# Patient Record
Sex: Female | Born: 1975
Health system: Southern US, Community
[De-identification: ages and names within clinical notes are randomized; demographics above are authoritative.]

## PROBLEM LIST (undated history)

## (undated) DIAGNOSIS — N871 Moderate cervical dysplasia: Secondary | ICD-10-CM

## (undated) DIAGNOSIS — N870 Mild cervical dysplasia: Secondary | ICD-10-CM

## (undated) DIAGNOSIS — F32A Depression, unspecified: Secondary | ICD-10-CM

## (undated) DIAGNOSIS — B977 Papillomavirus as the cause of diseases classified elsewhere: Secondary | ICD-10-CM

## (undated) DIAGNOSIS — F419 Anxiety disorder, unspecified: Secondary | ICD-10-CM

## (undated) DIAGNOSIS — D069 Carcinoma in situ of cervix, unspecified: Secondary | ICD-10-CM

## (undated) DIAGNOSIS — L709 Acne, unspecified: Secondary | ICD-10-CM

## (undated) DIAGNOSIS — Z789 Other specified health status: Secondary | ICD-10-CM

## (undated) DIAGNOSIS — F329 Major depressive disorder, single episode, unspecified: Secondary | ICD-10-CM

## (undated) HISTORY — DX: Moderate cervical dysplasia: N87.1

## (undated) HISTORY — DX: Anxiety disorder, unspecified: F41.9

## (undated) HISTORY — DX: Major depressive disorder, single episode, unspecified: F32.9

## (undated) HISTORY — DX: Other specified health status: Z78.9

## (undated) HISTORY — DX: Mild cervical dysplasia: N87.0

## (undated) HISTORY — DX: Papillomavirus as the cause of diseases classified elsewhere: B97.7

## (undated) HISTORY — DX: Carcinoma in situ of cervix, unspecified: D06.9

## (undated) HISTORY — DX: Acne, unspecified: L70.9

## (undated) HISTORY — DX: Depression, unspecified: F32.A

## (undated) HISTORY — PX: TYMPANOSTOMY TUBE PLACEMENT: SHX32

---

## 2000-08-28 ENCOUNTER — Other Ambulatory Visit: Admission: RE | Admit: 2000-08-28 | Discharge: 2000-08-28 | Payer: Self-pay | Admitting: Obstetrics and Gynecology

## 2001-07-24 ENCOUNTER — Other Ambulatory Visit: Admission: RE | Admit: 2001-07-24 | Discharge: 2001-07-24 | Payer: Self-pay | Admitting: *Deleted

## 2001-08-11 DIAGNOSIS — N871 Moderate cervical dysplasia: Secondary | ICD-10-CM

## 2001-08-11 HISTORY — DX: Moderate cervical dysplasia: N87.1

## 2001-10-11 DIAGNOSIS — D069 Carcinoma in situ of cervix, unspecified: Secondary | ICD-10-CM

## 2001-10-11 HISTORY — DX: Carcinoma in situ of cervix, unspecified: D06.9

## 2002-02-25 ENCOUNTER — Other Ambulatory Visit: Admission: RE | Admit: 2002-02-25 | Discharge: 2002-02-25 | Payer: Self-pay | Admitting: Gynecology

## 2002-10-28 ENCOUNTER — Other Ambulatory Visit: Admission: RE | Admit: 2002-10-28 | Discharge: 2002-10-28 | Payer: Self-pay | Admitting: *Deleted

## 2003-02-22 ENCOUNTER — Other Ambulatory Visit: Admission: RE | Admit: 2003-02-22 | Discharge: 2003-02-22 | Payer: Self-pay | Admitting: Gynecology

## 2003-09-22 ENCOUNTER — Other Ambulatory Visit: Admission: RE | Admit: 2003-09-22 | Discharge: 2003-09-22 | Payer: Self-pay | Admitting: Gynecology

## 2004-02-09 ENCOUNTER — Other Ambulatory Visit: Admission: RE | Admit: 2004-02-09 | Discharge: 2004-02-09 | Payer: Self-pay | Admitting: Obstetrics and Gynecology

## 2004-06-20 ENCOUNTER — Other Ambulatory Visit: Admission: RE | Admit: 2004-06-20 | Discharge: 2004-06-20 | Payer: Self-pay | Admitting: Gynecology

## 2004-12-13 ENCOUNTER — Other Ambulatory Visit: Admission: RE | Admit: 2004-12-13 | Discharge: 2004-12-13 | Payer: Self-pay | Admitting: Gynecology

## 2005-12-19 ENCOUNTER — Other Ambulatory Visit: Admission: RE | Admit: 2005-12-19 | Discharge: 2005-12-19 | Payer: Self-pay | Admitting: Gynecology

## 2006-12-22 ENCOUNTER — Other Ambulatory Visit: Admission: RE | Admit: 2006-12-22 | Discharge: 2006-12-22 | Payer: Self-pay | Admitting: Gynecology

## 2008-03-17 ENCOUNTER — Other Ambulatory Visit: Admission: RE | Admit: 2008-03-17 | Discharge: 2008-03-17 | Payer: Self-pay | Admitting: Gynecology

## 2008-08-02 ENCOUNTER — Emergency Department (HOSPITAL_COMMUNITY): Admission: EM | Admit: 2008-08-02 | Discharge: 2008-08-02 | Payer: Self-pay | Admitting: Family Medicine

## 2008-11-28 ENCOUNTER — Ambulatory Visit: Payer: Self-pay | Admitting: Vascular Surgery

## 2009-05-10 ENCOUNTER — Other Ambulatory Visit: Admission: RE | Admit: 2009-05-10 | Discharge: 2009-05-10 | Payer: Self-pay | Admitting: Gynecology

## 2009-05-10 ENCOUNTER — Ambulatory Visit: Payer: Self-pay | Admitting: Women's Health

## 2009-05-10 ENCOUNTER — Encounter: Payer: Self-pay | Admitting: Women's Health

## 2009-06-08 ENCOUNTER — Ambulatory Visit: Payer: Self-pay | Admitting: Gynecology

## 2009-06-08 DIAGNOSIS — N870 Mild cervical dysplasia: Secondary | ICD-10-CM

## 2009-06-08 HISTORY — DX: Mild cervical dysplasia: N87.0

## 2009-06-27 ENCOUNTER — Ambulatory Visit: Payer: Self-pay | Admitting: Gynecology

## 2009-06-27 HISTORY — PX: GYNECOLOGIC CRYOSURGERY: SHX857

## 2010-05-25 ENCOUNTER — Other Ambulatory Visit: Admission: RE | Admit: 2010-05-25 | Discharge: 2010-05-25 | Payer: Self-pay | Admitting: Gynecology

## 2010-05-25 ENCOUNTER — Ambulatory Visit: Payer: Self-pay | Admitting: Women's Health

## 2011-03-21 ENCOUNTER — Ambulatory Visit: Payer: Self-pay | Admitting: Women's Health

## 2011-04-12 DIAGNOSIS — Z789 Other specified health status: Secondary | ICD-10-CM

## 2011-04-12 HISTORY — DX: Other specified health status: Z78.9

## 2011-05-01 ENCOUNTER — Other Ambulatory Visit (HOSPITAL_COMMUNITY)
Admission: RE | Admit: 2011-05-01 | Discharge: 2011-05-01 | Disposition: A | Discharge status: Home / Self Care | Payer: 59 | Source: Ambulatory Visit | Attending: Gynecology | Admitting: Gynecology

## 2011-05-01 ENCOUNTER — Other Ambulatory Visit: Payer: Self-pay | Admitting: Women's Health

## 2011-05-01 ENCOUNTER — Encounter (INDEPENDENT_AMBULATORY_CARE_PROVIDER_SITE_OTHER): Payer: 59 | Admitting: Women's Health

## 2011-05-01 DIAGNOSIS — Z01419 Encounter for gynecological examination (general) (routine) without abnormal findings: Secondary | ICD-10-CM

## 2011-05-01 DIAGNOSIS — Z124 Encounter for screening for malignant neoplasm of cervix: Secondary | ICD-10-CM | POA: Insufficient documentation

## 2011-05-01 DIAGNOSIS — Z113 Encounter for screening for infections with a predominantly sexual mode of transmission: Secondary | ICD-10-CM

## 2011-06-06 ENCOUNTER — Other Ambulatory Visit: Payer: Self-pay | Admitting: *Deleted

## 2011-06-06 DIAGNOSIS — IMO0001 Reserved for inherently not codable concepts without codable children: Secondary | ICD-10-CM

## 2011-06-06 MED ORDER — NORGESTIM-ETH ESTRAD TRIPHASIC 0.18/0.215/0.25 MG-35 MCG PO TABS: 1.0000 | ORAL_TABLET | Freq: Every day | ORAL | Status: DC

## 2011-06-06 NOTE — Progress Notes (Signed)
rf request from pharmacy

## 2012-04-16 ENCOUNTER — Telehealth: Payer: Self-pay | Admitting: *Deleted

## 2012-04-16 ENCOUNTER — Other Ambulatory Visit: Payer: Self-pay | Admitting: Women's Health

## 2012-04-16 DIAGNOSIS — N912 Amenorrhea, unspecified: Secondary | ICD-10-CM

## 2012-04-16 DIAGNOSIS — O3680X0 Pregnancy with inconclusive fetal viability, not applicable or unspecified: Secondary | ICD-10-CM

## 2012-04-16 NOTE — Telephone Encounter (Signed)
Left message on cell, prenatal vitamin with DHA and will discuss office visit with viability ultrasound

## 2012-04-16 NOTE — Telephone Encounter (Signed)
Telephone call, states had a normal 5 day cycle on April 5, at normal time and then again on April 20 for 5 days. Has had 2 positive U PT's,  no bleeding, pain or discharge. Reviewed is between 7 and [redacted] weeks pregnant instructed to call office and will get a viability ultrasound. Aware we no longer deliver. Reviewed importance of prenatal vitamin daily and safe pregnancy behaviors reviewed. Married on April 6 and has used no contraception.

## 2012-04-16 NOTE — Telephone Encounter (Signed)
Pt has taken 2 UPT test and both were positive, she has remarried since April 6th and both are very happy about this good new. Pt would like to start on a prenatal vit. Her annual is scheduled on June 21. I told pt that she could do OTC vit. But she would like your recommendations. Call back number if needed [phone]. Please advise

## 2012-05-01 ENCOUNTER — Ambulatory Visit (INDEPENDENT_AMBULATORY_CARE_PROVIDER_SITE_OTHER): Payer: BC Managed Care – PPO | Admitting: Women's Health

## 2012-05-01 ENCOUNTER — Other Ambulatory Visit (HOSPITAL_COMMUNITY)
Admission: RE | Admit: 2012-05-01 | Discharge: 2012-05-01 | Disposition: A | Discharge status: Home / Self Care | Payer: BC Managed Care – PPO | Source: Ambulatory Visit | Attending: Women's Health | Admitting: Women's Health

## 2012-05-01 ENCOUNTER — Encounter: Payer: Self-pay | Admitting: Women's Health

## 2012-05-01 VITALS — BP 114/70 | Ht 63.75 in | Wt 138.0 lb

## 2012-05-01 DIAGNOSIS — Z3490 Encounter for supervision of normal pregnancy, unspecified, unspecified trimester: Secondary | ICD-10-CM

## 2012-05-01 DIAGNOSIS — Z01419 Encounter for gynecological examination (general) (routine) without abnormal findings: Secondary | ICD-10-CM | POA: Insufficient documentation

## 2012-05-01 DIAGNOSIS — Z124 Encounter for screening for malignant neoplasm of cervix: Secondary | ICD-10-CM

## 2012-05-01 DIAGNOSIS — Z331 Pregnant state, incidental: Secondary | ICD-10-CM

## 2012-05-01 DIAGNOSIS — N912 Amenorrhea, unspecified: Secondary | ICD-10-CM

## 2012-05-01 DIAGNOSIS — Z1159 Encounter for screening for other viral diseases: Secondary | ICD-10-CM | POA: Insufficient documentation

## 2012-05-01 LAB — PREGNANCY, URINE: Preg Test, Ur: POSITIVE

## 2012-05-01 MED ORDER — SERTRALINE HCL 25 MG PO TABS: 25.0000 mg | ORAL_TABLET | Freq: Every day | ORAL | Status: DC

## 2012-05-01 NOTE — Patient Instructions (Signed)
Pregnancy  If you are planning on getting pregnant, it is a good idea to make a preconception appointment with your care- giver to discuss having a healthy lifestyle before getting pregnant. Such as, diet, weight, exercise, taking prenatal vitamins especially folic acid (it helps prevent brain and spinal cord defects), avoiding alcohol, smoking and illegal drugs, medical problems (diabetes, convulsions), family history of genetic problems, working conditions and immunizations. It is better to have knowledge of these things and do something about them before getting pregnant.  In your pregnancy, it is important to follow certain guidelines to have a healthy baby. It is very important to get good prenatal care and follow your caregiver's instructions. Prenatal care includes all the medical care you receive before your baby's birth. This helps to prevent problems during the pregnancy and childbirth.  HOME CARE INSTRUCTIONS    Start your prenatal visits by the 12th week of pregnancy or before when possible. They are usually scheduled monthly at first. They are more often in the last 2 months before delivery. It is important that you keep your caregiver's appointments and follow your caregiver's instructions regarding medication use, exercise, and diet.   During pregnancy, you are providing food for you and your baby. Eat a regular, well-balanced diet. Choose foods such as meat, fish, milk and other dairy products, vegetables, fruits, whole-grain breads and cereals. Your caregiver will inform you of the ideal weight gain depending on your current height and weight. Drink lots of liquids. Try to drink 8 glasses of water a day.   Alcohol is associated with a number of birth defects including fetal alcohol syndrome. It is best to avoid alcohol completely. Smoking will cause low birth rate and prematurity. Use of alcohol and nicotine during your pregnancy also increases the chances that your child will be chemically  dependent later in their life and may contribute to SIDS (Sudden Infant Death Syndrome).   Do not use illegal drugs.   Only take prescription or over-the-counter medications that are recommended by your caregiver. Other medications can cause genetic and physical problems in the baby.   Morning sickness can often be helped by keeping soda crackers at the bedside. Eat a couple before arising in the morning.   A sexual relationship may be continued until near the end of pregnancy if there are no other problems such as early (premature) leaking of amniotic fluid from the membranes, vaginal bleeding, painful intercourse or belly (abdominal) pain.   Exercise regularly. Check with your caregiver if you are unsure of the safety of some of your exercises.   Do not use hot tubs, steam rooms or saunas. These increase the risk of fainting or passing out and hurting yourself and the baby. Swimming is OK for exercise. Get plenty of rest, including afternoon naps when possible especially in the third trimester.   Avoid toxic odors and chemicals.   Do not wear high heels. They may cause you to lose your balance and fall.   Do not lift over 5 pounds. If you do lift anything, lift with your legs and thighs, not your back.   Avoid long trips, especially in the third trimester.   If you have to travel out of the city or state, take a copy of your medical records with you.  SEEK IMMEDIATE MEDICAL CARE IF:    You develop an unexplained oral temperature above 102 F (38.9 C), or as your caregiver suggests.   You have leaking of fluid from the vagina. If   leaking membranes are suspected, take your temperature and inform your caregiver of this when you call.   There is vaginal spotting or bleeding. Notify your caregiver of the amount and how many pads are used.   You continue to feel sick to your stomach (nauseous) and have no relief from remedies suggested, or you throw up (vomit) blood or coffee ground like  materials.   You develop upper abdominal pain.   You have round ligament discomfort in the lower abdominal area. This still must be evaluated by your caregiver.   You feel contractions of the uterus.   You do not feel the baby move, or there is less movement than before.   You have painful urination.   You have abnormal vaginal discharge.   You have persistent diarrhea.   You get a severe headache.   You have problems with your vision.   You develop muscle weakness.   You feel dizzy and faint.   You develop shortness of breath.   You develop chest pain.   You have back pain that travels down to your leg and feet.   You feel irregular or a very fast heartbeat.   You develop excessive weight gain in a short period of time (5 pounds in 3 to 5 days).   You are involved with a domestic violence situation.  Document Released: 10/28/2005 Document Revised: 10/17/2011 Document Reviewed: 04/21/2009  ExitCare Patient Information 2012 [employer].

## 2012-05-01 NOTE — Progress Notes (Signed)
Hannah Flores 1976/11/09 [id number]    History:    The patient presents for annual exam.  Reports 2 positive home pregnancy tests 3 weeks ago. LMP 03/03/12. Denies abdominal pain, vaginal bleeding, or nausea. 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 Unsure of last Tdap. Hx depression, had been on cymbalta, changed to  Zoloft 25 mg prior to pregnancy, reports stable moods.    Past medical history, past surgical history, family history and social history were all reviewed and documented in the EPIC chart. Works in Therap[employer], sports.    ROS:  A  ROS was performed and pertinent positives and negatives are included in the history.  Exam:  Filed Vitals:   05/01/12 0809  BP: 114/70    General appearance:  Normal  Neurologic/psychiatric  Orientation:  Normal with appropriate conversation.  Mood/affect:  Normal  Genitourinary    Inguinal/mons:  Normal without inguinal adenopathy  External genitalia:  Normal  BUS/Urethra/Skene's glands:  Normal  Bladder:  Normal  Vagina:  Normal  Cervix:  Normal  Uterus:  Normal in size, shape and contour.  Midline and mobile  Adnexa/parametria:     Rt: Without masses or tenderness.   Lt: Without masses or tenderness.  Anus and perineum: Normal   Assessment:  36 y.o.  MWF G0 for annual exam with positive UPT.  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 Instructed to discontinue spirinolactone. Zoloft 25 mg prescription, use reviewed.  Pap  With HR HPV typing reviewed done, new screening guidelines reviewed. Encouraged daily prenatal vitamin with DHA and regular exercise. Instructed no alcohol,  avoid OTC medication use. Aware of safe pregnancy behaviors.  Return to office for viability [place].  Aware we no longer deliver.     Harrington Challenger [facility], 8:53 AM 05/01/2012

## 2012-05-08 ENCOUNTER — Encounter: Payer: Self-pay | Admitting: Women's Health

## 2012-05-08 ENCOUNTER — Ambulatory Visit (INDEPENDENT_AMBULATORY_CARE_PROVIDER_SITE_OTHER): Payer: BC Managed Care – PPO | Admitting: Women's Health

## 2012-05-08 ENCOUNTER — Ambulatory Visit (INDEPENDENT_AMBULATORY_CARE_PROVIDER_SITE_OTHER): Payer: BC Managed Care – PPO

## 2012-05-08 DIAGNOSIS — F329 Major depressive disorder, single episode, unspecified: Secondary | ICD-10-CM | POA: Insufficient documentation

## 2012-05-08 DIAGNOSIS — O09519 Supervision of elderly primigravida, unspecified trimester: Secondary | ICD-10-CM

## 2012-05-08 DIAGNOSIS — N912 Amenorrhea, unspecified: Secondary | ICD-10-CM

## 2012-05-08 DIAGNOSIS — O3680X0 Pregnancy with inconclusive fetal viability, not applicable or unspecified: Secondary | ICD-10-CM

## 2012-05-08 LAB — US OB TRANSVAGINAL

## 2012-05-08 NOTE — Progress Notes (Signed)
Presents for viability [place]. Denies vaginal bleeding, nausea, or abdominal pain.  Ultrasound: Intrauterine gestational sac seen with yolk sac. Fetal pole seen with FHT - 171. Ovaries normal. No free fluid seen. S=D. EDC: 12/01/12.  Pregnancy:  Hx: LGSIL/CIN I Negative HR HPV 05/01/12 Long term history of depression, stable on zoloft 25 mg daily  Plan: Prenatal vitamin with DHA daily encouraged. Referred to OB for pregnancy management, instructed to schedule. Copy of pap and [place] given with instructions to take to first prenanatal appointment.

## 2012-05-11 ENCOUNTER — Telehealth: Payer: Self-pay

## 2012-05-11 NOTE — Telephone Encounter (Signed)
Telephone call states not feeling increased depression but is feeling more short tempered with slight anxiety. States will remain on the 25 Zoloft and will review medication at first prenatal visit. Reviewed less is best especially first trimester.

## 2012-05-11 NOTE — Telephone Encounter (Signed)
Left message to call to discuss medication

## 2012-05-11 NOTE — Telephone Encounter (Signed)
YOU JUST SENT I RX 05-08-12 FOR GENERIC ZOLOFT 25MG . PT. STATES SHE HAS NOT PICKED IT UP YET AND IS REQUESTING TO INCREASE RX TO 50MG .

## 2012-06-30 LAB — OB RESULTS CONSOLE HIV ANTIBODY (ROUTINE TESTING): HIV: NONREACTIVE

## 2012-06-30 LAB — OB RESULTS CONSOLE ABO/RH: RH Type: POSITIVE

## 2012-07-08 LAB — OB RESULTS CONSOLE GC/CHLAMYDIA
Chlamydia: NEGATIVE
Gonorrhea: NEGATIVE

## 2012-11-02 LAB — OB RESULTS CONSOLE GBS: GBS: NEGATIVE

## 2012-11-28 ENCOUNTER — Encounter (HOSPITAL_COMMUNITY): Payer: Self-pay | Admitting: *Deleted

## 2012-11-28 ENCOUNTER — Inpatient Hospital Stay (HOSPITAL_COMMUNITY)
Admission: AD | Admit: 2012-11-28 | Discharge: 2012-12-01 | DRG: 765 | Disposition: A | Discharge status: Home / Self Care | Payer: 59 | Source: Ambulatory Visit | Attending: Obstetrics and Gynecology | Admitting: Obstetrics and Gynecology

## 2012-11-28 DIAGNOSIS — O9903 Anemia complicating the puerperium: Secondary | ICD-10-CM | POA: Diagnosis not present

## 2012-11-28 DIAGNOSIS — O09519 Supervision of elderly primigravida, unspecified trimester: Secondary | ICD-10-CM | POA: Diagnosis not present

## 2012-11-28 DIAGNOSIS — D62 Acute posthemorrhagic anemia: Secondary | ICD-10-CM | POA: Diagnosis not present

## 2012-11-28 DIAGNOSIS — Z98891 History of uterine scar from previous surgery: Secondary | ICD-10-CM | POA: Diagnosis not present

## 2012-11-28 DIAGNOSIS — O409XX0 Polyhydramnios, unspecified trimester, not applicable or unspecified: Secondary | ICD-10-CM | POA: Diagnosis present

## 2012-11-28 DIAGNOSIS — O3660X0 Maternal care for excessive fetal growth, unspecified trimester, not applicable or unspecified: Secondary | ICD-10-CM | POA: Diagnosis present

## 2012-11-28 LAB — POCT FERN TEST: POCT Fern Test: POSITIVE

## 2012-11-28 NOTE — Progress Notes (Signed)
Annabelle Harman RN in [facility] called with report. Pt to BS ambulatory from Triage.

## 2012-11-28 NOTE — MAU Note (Signed)
I've been leaking bloody water for about an hour. No pain

## 2012-11-29 ENCOUNTER — Encounter (HOSPITAL_COMMUNITY): Payer: Self-pay | Admitting: *Deleted

## 2012-11-29 ENCOUNTER — Inpatient Hospital Stay (HOSPITAL_COMMUNITY): Payer: 59 | Admitting: Anesthesiology

## 2012-11-29 ENCOUNTER — Encounter (HOSPITAL_COMMUNITY): Payer: Self-pay | Admitting: Anesthesiology

## 2012-11-29 ENCOUNTER — Encounter (HOSPITAL_COMMUNITY)
Admission: AD | Disposition: A | Discharge status: Home / Self Care | Payer: Self-pay | Source: Ambulatory Visit | Attending: Obstetrics and Gynecology

## 2012-11-29 LAB — CBC
Hemoglobin: 12.8 g/dL (ref 12.0–15.0)
MCH: 31.1 pg (ref 26.0–34.0)
MCHC: 34.6 g/dL (ref 30.0–36.0)

## 2012-11-29 LAB — RPR: RPR Ser Ql: NONREACTIVE

## 2012-11-29 SURGERY — Surgical Case
Anesthesia: Regional | Site: Abdomen | Wound class: Clean Contaminated

## 2012-11-29 MED ORDER — LACTATED RINGERS IV SOLN
INTRAVENOUS | Status: DC
  Administered 2012-11-29: 06:00:00 via INTRAVENOUS

## 2012-11-29 MED ORDER — ZOLPIDEM TARTRATE 5 MG PO TABS: 5.0000 mg | ORAL_TABLET | Freq: Every evening | ORAL | Status: DC | PRN

## 2012-11-29 MED ORDER — PHENYLEPHRINE 40 MCG/ML (10ML) SYRINGE FOR IV PUSH (FOR BLOOD PRESSURE SUPPORT)
PREFILLED_SYRINGE | INTRAVENOUS | Status: AC
  Filled 2012-11-29: qty 5

## 2012-11-29 MED ORDER — PRENATAL MULTIVITAMIN CH
1.0000 | ORAL_TABLET | Freq: Every day | ORAL | Status: DC
  Administered 2012-11-30 – 2012-12-01 (×2): 1 via ORAL
  Filled 2012-11-29 (×2): qty 1

## 2012-11-29 MED ORDER — CEFAZOLIN SODIUM-DEXTROSE 2-3 GM-% IV SOLR
INTRAVENOUS | Status: AC
  Filled 2012-11-29: qty 50

## 2012-11-29 MED ORDER — KETOROLAC TROMETHAMINE 30 MG/ML IJ SOLN: 30.0000 mg | Freq: Four times a day (QID) | INTRAMUSCULAR | Status: AC | PRN

## 2012-11-29 MED ORDER — ACETAMINOPHEN 325 MG PO TABS: 650.0000 mg | ORAL_TABLET | ORAL | Status: DC | PRN

## 2012-11-29 MED ORDER — IBUPROFEN 600 MG PO TABS
600.0000 mg | ORAL_TABLET | Freq: Four times a day (QID) | ORAL | Status: DC
  Administered 2012-11-29 – 2012-12-01 (×7): 600 mg via ORAL
  Filled 2012-11-29 (×7): qty 1

## 2012-11-29 MED ORDER — METHYLERGONOVINE MALEATE 0.2 MG PO TABS: 0.2000 mg | ORAL_TABLET | ORAL | Status: DC | PRN

## 2012-11-29 MED ORDER — OXYCODONE-ACETAMINOPHEN 5-325 MG PO TABS
1.0000 | ORAL_TABLET | ORAL | Status: DC | PRN
  Administered 2012-11-29 – 2012-12-01 (×9): 1 via ORAL
  Filled 2012-11-29 (×9): qty 1

## 2012-11-29 MED ORDER — PHENYLEPHRINE HCL 10 MG/ML IJ SOLN: INTRAMUSCULAR | Status: DC | PRN

## 2012-11-29 MED ORDER — BUPIVACAINE HCL (PF) 0.25 % IJ SOLN
INTRAMUSCULAR | Status: AC
  Filled 2012-11-29: qty 30

## 2012-11-29 MED ORDER — MORPHINE SULFATE (PF) 0.5 MG/ML IJ SOLN
INTRAMUSCULAR | Status: DC | PRN
  Administered 2012-11-29: .2 mg via INTRATHECAL

## 2012-11-29 MED ORDER — NALBUPHINE HCL 10 MG/ML IJ SOLN
5.0000 mg | INTRAMUSCULAR | Status: DC | PRN
  Filled 2012-11-29: qty 1

## 2012-11-29 MED ORDER — SERTRALINE HCL 25 MG PO TABS
25.0000 mg | ORAL_TABLET | Freq: Every day | ORAL | Status: DC
  Administered 2012-11-29 – 2012-12-01 (×2): 25 mg via ORAL
  Filled 2012-11-29 (×4): qty 1

## 2012-11-29 MED ORDER — NALOXONE HCL 1 MG/ML IJ SOLN: 1.0000 ug/kg/h | INTRAVENOUS | Status: DC | PRN

## 2012-11-29 MED ORDER — ONDANSETRON HCL 4 MG PO TABS: 4.0000 mg | ORAL_TABLET | ORAL | Status: DC | PRN

## 2012-11-29 MED ORDER — OXYTOCIN 40 UNITS IN LACTATED RINGERS INFUSION - SIMPLE MED
1.0000 m[IU]/min | INTRAVENOUS | Status: DC
  Administered 2012-11-29: 2 m[IU]/min via INTRAVENOUS
  Filled 2012-11-29: qty 1000

## 2012-11-29 MED ORDER — FENTANYL CITRATE 0.05 MG/ML IJ SOLN
INTRAMUSCULAR | Status: AC
  Filled 2012-11-29: qty 2

## 2012-11-29 MED ORDER — OXYTOCIN 40 UNITS IN LACTATED RINGERS INFUSION - SIMPLE MED
INTRAVENOUS | Status: DC | PRN
  Administered 2012-11-29: 40 [IU] via INTRAVENOUS

## 2012-11-29 MED ORDER — SODIUM CHLORIDE 0.9 % IJ SOLN: 3.0000 mL | INTRAMUSCULAR | Status: DC | PRN

## 2012-11-29 MED ORDER — SCOPOLAMINE 1 MG/3DAYS TD PT72
MEDICATED_PATCH | TRANSDERMAL | Status: AC
  Filled 2012-11-29: qty 1

## 2012-11-29 MED ORDER — DIPHENHYDRAMINE HCL 50 MG/ML IJ SOLN: 25.0000 mg | INTRAMUSCULAR | Status: DC | PRN

## 2012-11-29 MED ORDER — ONDANSETRON HCL 4 MG/2ML IJ SOLN: 4.0000 mg | Freq: Three times a day (TID) | INTRAMUSCULAR | Status: DC | PRN

## 2012-11-29 MED ORDER — DIPHENHYDRAMINE HCL 25 MG PO CAPS: 25.0000 mg | ORAL_CAPSULE | ORAL | Status: DC | PRN

## 2012-11-29 MED ORDER — IBUPROFEN 600 MG PO TABS: 600.0000 mg | ORAL_TABLET | Freq: Four times a day (QID) | ORAL | Status: DC | PRN

## 2012-11-29 MED ORDER — FENTANYL CITRATE 0.05 MG/ML IJ SOLN
INTRAMUSCULAR | Status: DC | PRN
  Administered 2012-11-29: 12.5 ug via INTRATHECAL

## 2012-11-29 MED ORDER — TETANUS-DIPHTH-ACELL PERTUSSIS 5-2.5-18.5 LF-MCG/0.5 IM SUSP
0.5000 mL | Freq: Once | INTRAMUSCULAR | Status: AC
  Administered 2012-11-30: 0.5 mL via INTRAMUSCULAR
  Filled 2012-11-29: qty 0.5

## 2012-11-29 MED ORDER — MEPERIDINE HCL 25 MG/ML IJ SOLN: 6.2500 mg | INTRAMUSCULAR | Status: DC | PRN

## 2012-11-29 MED ORDER — MORPHINE SULFATE 0.5 MG/ML IJ SOLN
INTRAMUSCULAR | Status: AC
  Filled 2012-11-29: qty 10

## 2012-11-29 MED ORDER — WITCH HAZEL-GLYCERIN EX PADS: 1.0000 "application " | MEDICATED_PAD | CUTANEOUS | Status: DC | PRN

## 2012-11-29 MED ORDER — CITRIC ACID-SODIUM CITRATE 334-500 MG/5ML PO SOLN
30.0000 mL | ORAL | Status: DC | PRN
  Administered 2012-11-29: 30 mL via ORAL
  Filled 2012-11-29: qty 15

## 2012-11-29 MED ORDER — LACTATED RINGERS IV SOLN
INTRAVENOUS | Status: DC
  Administered 2012-11-29: 20:00:00 via INTRAVENOUS

## 2012-11-29 MED ORDER — SODIUM CHLORIDE 0.9 % IR SOLN
Status: DC | PRN
  Administered 2012-11-29: 1000 mL

## 2012-11-29 MED ORDER — KETOROLAC TROMETHAMINE 30 MG/ML IJ SOLN: 15.0000 mg | Freq: Once | INTRAMUSCULAR | Status: DC | PRN

## 2012-11-29 MED ORDER — SIMETHICONE 80 MG PO CHEW: 80.0000 mg | CHEWABLE_TABLET | ORAL | Status: DC | PRN

## 2012-11-29 MED ORDER — DIPHENHYDRAMINE HCL 50 MG/ML IJ SOLN: 12.5000 mg | INTRAMUSCULAR | Status: DC | PRN

## 2012-11-29 MED ORDER — ONDANSETRON HCL 4 MG/2ML IJ SOLN: 4.0000 mg | INTRAMUSCULAR | Status: DC | PRN

## 2012-11-29 MED ORDER — FLEET ENEMA 7-19 GM/118ML RE ENEM: 1.0000 | ENEMA | RECTAL | Status: DC | PRN

## 2012-11-29 MED ORDER — KETOROLAC TROMETHAMINE 60 MG/2ML IM SOLN
60.0000 mg | Freq: Once | INTRAMUSCULAR | Status: AC | PRN
  Administered 2012-11-29: 60 mg via INTRAMUSCULAR

## 2012-11-29 MED ORDER — OXYTOCIN BOLUS FROM INFUSION: 500.0000 mL | INTRAVENOUS | Status: DC

## 2012-11-29 MED ORDER — DIPHENHYDRAMINE HCL 25 MG PO CAPS: 25.0000 mg | ORAL_CAPSULE | Freq: Four times a day (QID) | ORAL | Status: DC | PRN

## 2012-11-29 MED ORDER — MENTHOL 3 MG MT LOZG
1.0000 | LOZENGE | OROMUCOSAL | Status: DC | PRN
  Administered 2012-11-29: 3 mg via ORAL
  Filled 2012-11-29: qty 9

## 2012-11-29 MED ORDER — PROMETHAZINE HCL 25 MG/ML IJ SOLN: 6.2500 mg | INTRAMUSCULAR | Status: DC | PRN

## 2012-11-29 MED ORDER — OXYTOCIN 40 UNITS IN LACTATED RINGERS INFUSION - SIMPLE MED: 62.5000 mL/h | INTRAVENOUS | Status: AC

## 2012-11-29 MED ORDER — SCOPOLAMINE 1 MG/3DAYS TD PT72
1.0000 | MEDICATED_PATCH | Freq: Once | TRANSDERMAL | Status: DC
  Administered 2012-11-29: 1.5 mg via TRANSDERMAL

## 2012-11-29 MED ORDER — PHENYLEPHRINE HCL 10 MG/ML IJ SOLN
INTRAMUSCULAR | Status: DC | PRN
  Administered 2012-11-29: 40 ug via INTRAVENOUS
  Administered 2012-11-29 (×2): 80 ug via INTRAVENOUS
  Administered 2012-11-29: 40 ug via INTRAVENOUS

## 2012-11-29 MED ORDER — EPHEDRINE 5 MG/ML INJ
INTRAVENOUS | Status: AC
  Filled 2012-11-29: qty 10

## 2012-11-29 MED ORDER — LIDOCAINE HCL (PF) 1 % IJ SOLN: 30.0000 mL | INTRAMUSCULAR | Status: DC | PRN

## 2012-11-29 MED ORDER — TERBUTALINE SULFATE 1 MG/ML IJ SOLN: 0.2500 mg | Freq: Once | INTRAMUSCULAR | Status: DC | PRN

## 2012-11-29 MED ORDER — OXYTOCIN 40 UNITS IN LACTATED RINGERS INFUSION - SIMPLE MED: 62.5000 mL/h | INTRAVENOUS | Status: DC

## 2012-11-29 MED ORDER — METOCLOPRAMIDE HCL 5 MG/ML IJ SOLN: 10.0000 mg | Freq: Three times a day (TID) | INTRAMUSCULAR | Status: DC | PRN

## 2012-11-29 MED ORDER — LANOLIN HYDROUS EX OINT: 1.0000 "application " | TOPICAL_OINTMENT | CUTANEOUS | Status: DC | PRN

## 2012-11-29 MED ORDER — OXYTOCIN 10 UNIT/ML IJ SOLN
INTRAMUSCULAR | Status: AC
  Filled 2012-11-29: qty 4

## 2012-11-29 MED ORDER — ONDANSETRON HCL 4 MG/2ML IJ SOLN
INTRAMUSCULAR | Status: DC | PRN
  Administered 2012-11-29: 4 mg via INTRAVENOUS

## 2012-11-29 MED ORDER — LACTATED RINGERS IV SOLN
INTRAVENOUS | Status: DC | PRN
  Administered 2012-11-29 (×3): via INTRAVENOUS

## 2012-11-29 MED ORDER — DIBUCAINE 1 % RE OINT: 1.0000 "application " | TOPICAL_OINTMENT | RECTAL | Status: DC | PRN

## 2012-11-29 MED ORDER — NALOXONE HCL 0.4 MG/ML IJ SOLN: 0.4000 mg | INTRAMUSCULAR | Status: DC | PRN

## 2012-11-29 MED ORDER — ONDANSETRON HCL 4 MG/2ML IJ SOLN: 4.0000 mg | Freq: Four times a day (QID) | INTRAMUSCULAR | Status: DC | PRN

## 2012-11-29 MED ORDER — CEFAZOLIN SODIUM-DEXTROSE 2-3 GM-% IV SOLR
INTRAVENOUS | Status: DC | PRN
  Administered 2012-11-29: 2 g via INTRAVENOUS

## 2012-11-29 MED ORDER — BUPIVACAINE IN DEXTROSE 0.75-8.25 % IT SOLN
INTRATHECAL | Status: DC | PRN
  Administered 2012-11-29: 1.4 mL via INTRATHECAL

## 2012-11-29 MED ORDER — ONDANSETRON HCL 4 MG/2ML IJ SOLN
INTRAMUSCULAR | Status: AC
  Filled 2012-11-29: qty 2

## 2012-11-29 MED ORDER — CEFAZOLIN SODIUM-DEXTROSE 2-3 GM-% IV SOLR: 2.0000 g | INTRAVENOUS | Status: DC

## 2012-11-29 MED ORDER — OXYCODONE-ACETAMINOPHEN 5-325 MG PO TABS: 1.0000 | ORAL_TABLET | ORAL | Status: DC | PRN

## 2012-11-29 MED ORDER — SIMETHICONE 80 MG PO CHEW
80.0000 mg | CHEWABLE_TABLET | Freq: Three times a day (TID) | ORAL | Status: DC
  Administered 2012-11-29 – 2012-12-01 (×7): 80 mg via ORAL

## 2012-11-29 MED ORDER — KETOROLAC TROMETHAMINE 60 MG/2ML IM SOLN
INTRAMUSCULAR | Status: AC
  Filled 2012-11-29: qty 2

## 2012-11-29 MED ORDER — LACTATED RINGERS IV SOLN: 500.0000 mL | INTRAVENOUS | Status: DC | PRN

## 2012-11-29 MED ORDER — METHYLERGONOVINE MALEATE 0.2 MG/ML IJ SOLN: 0.2000 mg | INTRAMUSCULAR | Status: DC | PRN

## 2012-11-29 MED ORDER — SENNOSIDES-DOCUSATE SODIUM 8.6-50 MG PO TABS
2.0000 | ORAL_TABLET | Freq: Every day | ORAL | Status: DC
  Administered 2012-11-29 – 2012-11-30 (×2): 2 via ORAL

## 2012-11-29 MED ORDER — BUPIVACAINE HCL (PF) 0.25 % IJ SOLN
INTRAMUSCULAR | Status: DC | PRN
  Administered 2012-11-29: 10 mL

## 2012-11-29 MED ORDER — HYDROMORPHONE HCL PF 1 MG/ML IJ SOLN: 0.2500 mg | INTRAMUSCULAR | Status: DC | PRN

## 2012-11-29 SURGICAL SUPPLY — 33 items
CLOTH BEACON ORANGE TIMEOUT ST (SAFETY) ×2 IMPLANT
CONTAINER PREFILL 10% NBF 15ML (MISCELLANEOUS) IMPLANT
DRAPE LG THREE QUARTER DISP (DRAPES) ×2 IMPLANT
DRESSING TELFA 8X3 (GAUZE/BANDAGES/DRESSINGS) IMPLANT
DRSG OPSITE POSTOP 4X10 (GAUZE/BANDAGES/DRESSINGS) ×2 IMPLANT
DURAPREP 26ML APPLICATOR (WOUND CARE) ×2 IMPLANT
ELECT REM PT RETURN 9FT ADLT (ELECTROSURGICAL) ×2
ELECTRODE REM PT RTRN 9FT ADLT (ELECTROSURGICAL) ×1 IMPLANT
EXTRACTOR VACUUM M CUP 4 TUBE (SUCTIONS) IMPLANT
GAUZE SPONGE 4X4 12PLY STRL LF (GAUZE/BANDAGES/DRESSINGS) ×2 IMPLANT
GLOVE BIO SURGEON STRL SZ7.5 (GLOVE) ×4 IMPLANT
GOWN PREVENTION PLUS LG XLONG (DISPOSABLE) ×4 IMPLANT
GOWN PREVENTION PLUS XLARGE (GOWN DISPOSABLE) ×2 IMPLANT
KIT ABG SYR 3ML LUER SLIP (SYRINGE) IMPLANT
NDL HYPO 25X1 1.5 SAFETY (NEEDLE) ×1 IMPLANT
NDL HYPO 25X5/8 SAFETYGLIDE (NEEDLE) IMPLANT
NEEDLE HYPO 25X1 1.5 SAFETY (NEEDLE) ×2 IMPLANT
NEEDLE HYPO 25X5/8 SAFETYGLIDE (NEEDLE) IMPLANT
NS IRRIG 1000ML POUR BTL (IV SOLUTION) ×2 IMPLANT
PACK C SECTION WH (CUSTOM PROCEDURE TRAY) ×2 IMPLANT
PAD ABD 7.5X8 STRL (GAUZE/BANDAGES/DRESSINGS) IMPLANT
SLEEVE SCD COMPRESS KNEE MED (MISCELLANEOUS) IMPLANT
STAPLER VISISTAT 35W (STAPLE) ×2 IMPLANT
SUT MNCRL 0 VIOLET CTX 36 (SUTURE) ×2 IMPLANT
SUT MON AB 2-0 CT1 27 (SUTURE) ×2 IMPLANT
SUT MON AB-0 CT1 36 (SUTURE) ×4 IMPLANT
SUT MONOCRYL 0 CTX 36 (SUTURE) ×2
SUT PLAIN 0 NONE (SUTURE) IMPLANT
SUT PLAIN 2 0 XLH (SUTURE) IMPLANT
SYR CONTROL 10ML LL (SYRINGE) ×2 IMPLANT
TOWEL OR 17X24 6PK STRL BLUE (TOWEL DISPOSABLE) ×6 IMPLANT
TRAY FOLEY CATH 14FR (SET/KITS/TRAYS/PACK) ×2 IMPLANT
WATER STERILE IRR 1000ML POUR (IV SOLUTION) ×2 IMPLANT

## 2012-11-29 NOTE — Progress Notes (Signed)
Hannah Flores is a 37 y.o. G1P0 at [redacted]w[redacted]d by LMP admitted for rupture of membranes  Subjective: comfortable  Objective: BP 133/81  Pulse 72  Temp 98.2 F (36.8 C) (Oral)  Resp 18  Ht 5' 3.5" (1.613 m)  Wt 74.571 kg (164 lb 6.4 oz)  BMI 28.67 kg/m2  SpO2 100%  LMP 02/29/2012      FHT:  FHR: 145 bpm, variability: moderate,  accelerations:  Present,  decelerations:  Absent UC:   irregular, every 4 minutes SVE:   Dilation: Fingertip (dimple) Exam by:: A. Snowflake, [facility]  Labs: Lab Results  Component Value Date   WBC 15.1* 11/29/2012   HGB 12.8 11/29/2012   HCT 37.0 11/29/2012   MCV 89.8 11/29/2012   PLT 168 11/29/2012    Assessment / Plan: Augmentation of labor, progressing well  Labor: Progressing normally Preeclampsia:  na Fetal Wellbeing:  Category I Pain Control:  Labor support without medications I/D:  n/a Anticipated MOD:  Likely csec poss LGA,   Hannah Flores 11/29/2012, 8:37 AM

## 2012-11-29 NOTE — Anesthesia Postprocedure Evaluation (Signed)
Anesthesia Post Note  Patient: Hannah Flores  Procedure(s) Performed: Procedure(s) (LRB): CESAREAN SECTION (N/A)  Anesthesia type: Spinal  Patient location: PACU  Post pain: Pain level controlled  Post assessment: Post-op Vital signs reviewed  Last Vitals:  Filed Vitals:   11/29/12 1330  BP: 131/65  Pulse: 81  Temp:   Resp: 17    Post vital signs: Reviewed  Level of consciousness: awake  Complications: No apparent anesthesia complications

## 2012-11-29 NOTE — Progress Notes (Signed)
Hamsini B Mikus is a 37 y.o. G1P0 at [redacted]w[redacted]d by LMP admitted for rupture of membranes  Subjective: comfortable  Objective: BP 158/70  Pulse 82  Temp 98.4 F (36.9 C) (Oral)  Resp 20  Ht 5' 3.5" (1.613 m)  Wt 74.571 kg (164 lb 6.4 oz)  BMI 28.67 kg/m2  LMP 02/29/2012      FHT:  FHR: 155 bpm, variability: moderate,  accelerations:  Present,  decelerations:  Absent UC:   none SVE:   Dilation: Closed Exam by:: A. Tuttle, RNC  Labs: No results found for this basename: WBC, HGB, HCT, MCV, PLT    Assessment / Plan: SROM at term  Labor: Progressing normally Preeclampsia:  na Fetal Wellbeing:  Category I Pain Control:  Labor support without medications I/D:  n/a Anticipated MOD:  Likely csection  Willian Donson J 11/29/2012, 12:15 AM

## 2012-11-29 NOTE — Anesthesia Preprocedure Evaluation (Signed)
Anesthesia Evaluation  Patient identified by MRN, date of birth, ID band Patient awake    Reviewed: Allergy & Precautions, H&P , NPO status , Patient's Chart, lab work & pertinent test results  Airway Mallampati: I TM Distance: >3 FB Neck ROM: full    Dental No notable dental hx.    Pulmonary neg pulmonary ROS,    Pulmonary exam normal       Cardiovascular negative cardio ROS      Neuro/Psych negative neurological ROS     GI/Hepatic negative GI ROS, Neg liver ROS,   Endo/Other  negative endocrine ROS  Renal/GU negative Renal ROS  negative genitourinary   Musculoskeletal negative musculoskeletal ROS (+)   Abdominal Normal abdominal exam  (+)   Peds negative pediatric ROS (+)  Hematology negative hematology ROS (+)   Anesthesia Other Findings   Reproductive/Obstetrics (+) Pregnancy                           Anesthesia Physical Anesthesia Plan  ASA: II  Anesthesia Plan: Spinal   Post-op Pain Management:    Induction:   Airway Management Planned:   Additional Equipment:   Intra-op Plan:   Post-operative Plan:   Informed Consent: I have reviewed the patients History and Physical, chart, labs and discussed the procedure including the risks, benefits and alternatives for the proposed anesthesia with the patient or authorized representative who has indicated his/her understanding and acceptance.     Plan Discussed with: CRNA and Surgeon  Anesthesia Plan Comments:         Anesthesia Quick Evaluation  

## 2012-11-29 NOTE — Transfer of Care (Signed)
Immediate Anesthesia Transfer of Care Note  Patient: Hannah Flores  Procedure(s) Performed: Procedure(s) (LRB) with comments: CESAREAN SECTION (N/A) - Primary cesarean section with delivery of baby boy at 88.   Apgars 8/8.  Patient Location: PACU  Anesthesia Type:Spinal  Level of Consciousness: awake, alert  and oriented  Airway & Oxygen Therapy: Patient Spontanous Breathing  Post-op Assessment: Report given to PACU RN and Post -op Vital signs reviewed and stable  Post vital signs: Reviewed and stable  Complications: No apparent anesthesia complications

## 2012-11-29 NOTE — Anesthesia Procedure Notes (Signed)
Spinal  Patient location during procedure: OR Start time: 11/29/2012 12:30 PM End time: 11/29/2012 12:33 PM Staffing Anesthesiologist: Sandrea Hughs Preanesthetic Checklist Completed: patient identified, site marked, surgical consent, pre-op evaluation, timeout performed, IV checked, risks and benefits discussed and monitors and equipment checked Spinal Block Patient position: sitting Prep: DuraPrep Patient monitoring: heart rate, cardiac monitor, continuous pulse ox and blood pressure Approach: midline Location: L3-4 Injection technique: single-shot Needle Needle type: Sprotte  Needle gauge: 24 G Needle length: 9 cm Assessment Sensory level: T4

## 2012-11-29 NOTE — Addendum Note (Signed)
Addendum  created 11/29/12 1800 by Renford Dills, CRNA   Modules edited:Notes Section

## 2012-11-29 NOTE — Progress Notes (Signed)
Hannah Flores is a 37 y.o. G1P0 at [redacted]w[redacted]d by LMP admitted for rupture of membranes  Subjective: Getting uncomfortable  Objective: BP 127/67  Pulse 80  Temp 97.9 F (36.6 C) (Oral)  Resp 18  Ht 5' 3.5" (1.613 m)  Wt 74.571 kg (164 lb 6.4 oz)  BMI 28.67 kg/m2  SpO2 100%  LMP 02/29/2012      FHT:  FHR: 125 bpm, variability: minimal ,  accelerations:  Present,  decelerations:  Present occ  variables with late return UC:   irregular, every 4 minutes SVE:   FT/100/-2  Labs: Lab Results  Component Value Date   WBC 15.1* 11/29/2012   HGB 12.8 11/29/2012   HCT 37.0 11/29/2012   MCV 89.8 11/29/2012   PLT 168 11/29/2012    Assessment / Plan: Protracted latent phase Non reassuring FHR with inability to augment  Labor: no progress Preeclampsia:  na Fetal Wellbeing:  Category II Pain Control:  Labor support without medications I/D:  n/a Anticipated MOD:  Risks vs benefits of csection discussed. Consent signed. Inability to augment labor due to decelerations discussed.  Elisheba Mcdonnell J 11/29/2012, 12:03 PM

## 2012-11-29 NOTE — Anesthesia Postprocedure Evaluation (Signed)
  Anesthesia Post-op Note  Patient: Hannah Flores  Procedure(s) Performed: Procedure(s) (LRB) with comments: CESAREAN SECTION (N/A) - Primary cesarean section with delivery of baby boy at 69.   Apgars 8/8.  Patient Location: Mother/Baby  Anesthesia Type:Spinal  Level of Consciousness: awake  Airway and Oxygen Therapy: Patient Spontanous Breathing  Post-op Pain: mild  Post-op Assessment: Patient's Cardiovascular Status Stable and Respiratory Function Stable  Post-op Vital Signs: stable  Complications: No apparent anesthesia complications

## 2012-11-29 NOTE — H&P (Signed)
Hannah Flores, MCCORRY                   ACCOUNT NO.:  [id number]  MEDICAL RECORD NO.:  [id number]  LOCATION:  9160                          FACILITY:  [facility]  PHYSICIAN:  Lenoard Aden, M.D.DATE OF BIRTH:  1975/12/27  DATE OF ADMISSION:  11/28/2012 DATE OF DISCHARGE:                             HISTORY & PHYSICAL   CHIEF COMPLAINT:  Spontaneous rupture of membranes.  HISTORY OF PRESENT ILLNESS:  She is a 37 year old white female G1, P0, at [redacted] weeks gestation with spontaneous rupture of membranes at 10 p.m. she has no known drug allergies.  Medications are prenatal vitamins. Pregnancy was complicated by polyhydramnios and projected large-for- gestational-age fetus.  She has medications to include Zoloft and prenatal vitamins.  She has a family historyof stroke.  She has a noncontributory surgical or previous pregnancy history.  PHYSICAL EXAMINATION:  GENERAL:  She is a well-developed, well- nourished, white female, in no acute distress. HEENT:  Normal. NECK:  Supple.  Full range of motion. LUNGS:  Clear. HEART:  Regular rhythm. ABDOMEN:  Soft, gravid, nontender.  Estimated fetal weight 8 to 8-1/2 pounds.  Cervix is fingertip, 80%, vertex, -2. EXTREMITIES:  There are no cords. NEUROLOGIC:  Nonfocal. SKIN:  Intact.  IMPRESSION: 1. Term intrauterine pregnancy with spontaneous rupture of membranes. 2. GBS negative. 3. Large for gestational age with polyhydramnios.  PLAN:  Admit, augment as needed.  Anticipate cautious approach to vaginal birth, likely C-section.  Risks and benefits were discussed.     Lenoard Aden, M.D.     RJT/MEDQ  D:  11/29/2012  T:  11/29/2012  Job:  [id number]

## 2012-11-29 NOTE — OR Nursing (Addendum)
Uterus massaged by S. Viaan Knippenberg Charity fundraiser.  Two tubes of cord blood sent to lab.  10cc of blood evacuated from uterus during uterine massage.

## 2012-11-29 NOTE — Op Note (Signed)
Cesarean Section Procedure Note  Indications: non-reassuring fetal status  Pre-operative Diagnosis: 39 week 1 day pregnancy.  Post-operative Diagnosis: same  Surgeon: Lenoard Aden   Assistants: none  Anesthesia: Local anesthesia 0.25.% bupivacaine and Spinal anesthesia  ASA Class: 2  Procedure Details  The patient was seen in the Holding Room. The risks, benefits, complications, treatment options, and expected outcomes were discussed with the patient.  The patient concurred with the proposed plan, giving informed consent. The risks of anesthesia, infection, bleeding and possible injury to other organs discussed. Injury to bowel, bladder, or ureter with possible need for repair discussed. Possible need for transfusion with secondary risks of hepatitis or HIV acquisition discussed. Post operative complications to include but not limited to DVT, PE and Pneumonia noted. The site of surgery properly noted/marked. The patient was taken to Operating Room # 9, identified as Hannah Flores and the procedure verified as C-Section Delivery. A Time Out was held and the above information confirmed.  After induction of anesthesia, the patient was draped and prepped in the usual sterile manner. A Pfannenstiel incision was made and carried down through the subcutaneous tissue to the fascia. Fascial incision was made and extended transversely using Mayo scissors. The fascia was separated from the underlying rectus tissue superiorly and inferiorly. The peritoneum was identified and entered. Peritoneal incision was extended longitudinally. The utero-vesical peritoneal reflection was incised transversely and the bladder flap was bluntly freed from the lower uterine segment. A low transverse uterine incision(Kerr hysterotomy) was made. Delivered from OA presentation was a  female with Apgar scores of 8 at one minute and 9 at five minutes. Bulb suctioning gently performed. Neonatal team in attendance.After the umbilical  cord was clamped and cut cord blood was obtained for evaluation. The placenta was removed intact and appeared normal. The uterus was curetted with a dry lap pack. Good hemostasis was noted.The uterine outline, tubes and ovaries appeared normal. The uterine incision was closed with running locked sutures of 0 Monocryl x 2 layers. Hemostasis was observed. Lavage was carried out until clear.The parietal peritoneum was closed with a running 2-0 Monocryl suture. The fascia was then reapproximated with running sutures of 0 Monocryl. The skin was reapproximated with staples.  Instrument, sponge, and needle counts were correct prior the abdominal closure and at the conclusion of the case.   Findings: Above Loose nuchal cord x one Shoulder cord x one  Estimated Blood Loss:  300 mL         Drains: foley                 Specimens: placenta                 Complications:  None; patient tolerated the procedure well.         Disposition: PACU - hemodynamically stable.         Condition: stable  Attending Attestation: I performed the procedure.

## 2012-11-30 ENCOUNTER — Encounter (HOSPITAL_COMMUNITY): Payer: Self-pay | Admitting: Obstetrics and Gynecology

## 2012-11-30 DIAGNOSIS — Z98891 History of uterine scar from previous surgery: Secondary | ICD-10-CM | POA: Diagnosis not present

## 2012-11-30 LAB — CBC
HCT: 26 % — ABNORMAL LOW (ref 36.0–46.0)
MCHC: 34.6 g/dL (ref 30.0–36.0)
MCV: 90.3 fL (ref 78.0–100.0)
Platelets: 132 10*3/uL — ABNORMAL LOW (ref 150–400)
RDW: 13.5 % (ref 11.5–15.5)

## 2012-11-30 MED ORDER — POLYSACCHARIDE IRON COMPLEX 150 MG PO CAPS
150.0000 mg | ORAL_CAPSULE | Freq: Every day | ORAL | Status: DC
  Administered 2012-11-30 – 2012-12-01 (×2): 150 mg via ORAL
  Filled 2012-11-30 (×3): qty 1

## 2012-11-30 NOTE — Progress Notes (Signed)
Subjective: Postpartum Day 1: Primary Cesarean Delivery secondary to: Non reassuring FH Tracing Patient reports incisional pain, tolerating PO, + flatus and no problems voiding.  +BM no complaints, up ad lib without syncope Pain well controlled with po meds BF on demand and lactation will help mother get breastfeeding established. Mood stable, bonding well  Objective: Vital signs in last 24 hours: Temp:  [97.9 F (36.6 C)-98.9 F (37.2 C)] 98.2 F (36.8 C) (01/20 0904) Pulse Rate:  [61-92] 61  (01/20 0904) Resp:  [12-36] 16  (01/20 0904) BP: (106-135)/(65-74) 106/65 mmHg (01/20 0904) SpO2:  [97 %-100 %] 99 % (01/20 0904)  Physical Exam:  General: alert, cooperative and no distress Breasts: N/T Heart: RRR Lungs: CTAB Abdomen: BS x4,  No flatus as yet Uterine Fundus: firm, -2/u Incision: healing well, no significant drainage, no dehiscence, no significant erythema, Clear dressing remains in place and staples to skin.  Lochia: appropriate DVT Evaluation: No evidence of DVT seen on physical exam. Negative Homan's sign. No cords or calf tenderness. No significant calf/ankle edema. SCD's remain in place while in bed. Advised ambulation.   Basename 11/30/12 0535 11/29/12 0540  HGB 9.0* 12.8  HCT 26.0* 37.0   Anemia of pregnancy - delivered. Commenced on Niferex 150 mgs po daily.  Assessment/Plan: Status post Cesarean section. Doing well postoperatively.  Continue current care.   Havannah Streat, CNM. 11/30/2012, 9:26 AM

## 2012-11-30 NOTE — Progress Notes (Signed)

## 2012-11-30 NOTE — Progress Notes (Signed)
UR chart review completed.  

## 2012-12-01 ENCOUNTER — Encounter (HOSPITAL_COMMUNITY): Payer: Self-pay

## 2012-12-01 DIAGNOSIS — D62 Acute posthemorrhagic anemia: Secondary | ICD-10-CM | POA: Diagnosis not present

## 2012-12-01 MED ORDER — IBUPROFEN 600 MG PO TABS: 600.0000 mg | ORAL_TABLET | Freq: Four times a day (QID) | ORAL | Status: DC

## 2012-12-01 MED ORDER — POLYSACCHARIDE IRON COMPLEX 150 MG PO CAPS: 150.0000 mg | ORAL_CAPSULE | Freq: Every day | ORAL | Status: DC

## 2012-12-01 NOTE — Discharge Summary (Signed)
POSTOPERATIVE DISCHARGE SUMMARY:  Patient ID: Hannah Flores MRN: [id number] DOB/AGE: [birth year] 37 y.o.  Admit date: 11/28/2012 Admission Diagnoses: 39 weeks SROM / LGA / polyhdramnios    Discharge date:  12/01/2012  Discharge Diagnoses: POD 2 s/p Cesarean section / mild ABL anemia  Prenatal history: G1P1001   EDC : 12/05/2012, by Last Menstrual Period  Prenatal care at [facility]  Primary provider : Taavon Prenatal course complicated by LGA and polyhydramnios  Prenatal Labs: ABO, Rh: A (08/20 0000)  Antibody: Negative (08/20 0000) Rubella: Immune (08/20 0000)  RPR: NON REACTIVE (01/19 0540)  HBsAg: Negative (08/20 0000)  HIV: Non-reactive (08/20 0000)  GBS: Negative (12/23 0000)  1 hr Glucola : nl   Medical / Surgical History :  Past medical history:  Past Medical History  Diagnosis Date  . HPV in female   . CIN II (cervical intraepithelial neoplasia II) 10/02     cx bx  . CIN III (cervical intraepithelial neoplasia III) 12/02    S/P LEEP  . Immune to rubella 04/2011    POSITIVE RUBELLA TITER  . Depression   . Anxiety   . Acne   . CIN I (cervical intraepithelial neoplasia I) 06/08/09    CIN I AND HPV EFFECT ON PATH FROM BIOPSY. S/P CRYOSURGERY    Past surgical history:  Past Surgical History  Procedure Date  . Tympanostomy tube placement   . Gynecologic cryosurgery 06/27/09  . Cesarean section 11/29/2012    Procedure: CESAREAN SECTION;  Surgeon: Lenoard Aden, MD;  Location: [facility];  Service: Obstetrics;  Laterality: N/A;  Primary cesarean section with delivery of baby boy at 87.   Apgars 8/8.    Family History:  Family History  Problem Relation Age of Onset  . Hypertension Father   . Cancer Paternal Grandmother     LUNG CANCER--SMOKER  . Cancer Other     ovarian    Social History:  reports that she has never smoked. She has never used smokeless tobacco. She reports that she does not drink alcohol or use illicit  drugs.   Allergies: Review of patient's allergies indicates no known allergies.    Current Medications at time of admission:  Prior to Admission medications   Medication Sig Start Date End Date Taking? Authorizing Provider  Prenatal Vit-Fe Sulfate-FA (PRENATAL VITAMIN PO) Take by mouth.   Yes Historical Provider, MD  sertraline (ZOLOFT) 25 MG tablet Take 1 tablet (25 mg total) by mouth daily. 05/01/12  Yes Harrington Challenger, NP                  Intrapartum Course:  admit for SROM in latent labor - inability to augment labor due to fetal heart rate decelerations - decision to proceed to cesarean delivery due to non-reassuring FHR  Procedures: Cesarean section delivery of female newborn by Dr Billy Coast  See operative report for further details APGAR (1 MIN): 8   APGAR (5 MINS): 9    Postoperative / postpartum course: uneventful - discharged POD 2  Physical Exam:   VSS: Temp:  [98.1 F (36.7 C)-98.6 F (37 C)] 98.6 F (37 C) (01/21 0525) Pulse Rate:  [72-80] 72  (01/21 0525) Resp:  [18] 18  (01/21 0525) BP: (120-122)/(65-71) 120/71 mmHg (01/21 0525)  LABS:  Basename 11/30/12 0535 11/29/12 0540  WBC 14.5* 15.1*  HGB 9.0* 12.8  PLT 132* 168    General:pleasant / NAD Heart: RR Lungs: clear and unlabored Abdomen: active BS /  soft and non-tender / non-distended Extremities: trace edema   Incision:  approximated with staples / no erythema / small ecchymosis / dried drainage Staples: intact - honeycomb dressing intact for removal of POD 5 at [facility]  Discharge Instructions:  Discharged Condition: stable Activity: pelvic rest and postoperative restrictions x 2 weeks Diet: routine Medications: see below   Medication List     As of 12/01/2012  2:17 PM    TAKE these medications         ibuprofen 600 MG tablet   Commonly known as: ADVIL,MOTRIN   Take 1 tablet (600 mg total) by mouth every 6 (six) hours.      iron polysaccharides 150 MG capsule   Commonly known as: NIFEREX    Take 1 capsule (150 mg total) by mouth daily.      PRENATAL VITAMIN PO   Take by mouth.      sertraline 25 MG tablet   Commonly known as: ZOLOFT   Take 1 tablet (25 mg total) by mouth daily.        Postpartum Instructions: [facility] discharge booklet - instructions reviewed Discharge to: Home  Follow up :   [facility] in 6weeks for routine postpartum visit                      Interval visit at [facility]  In 3days for staple removal   Signed: Marlinda Mike CNM, MSN 12/01/2012, 2:17 PM

## 2012-12-01 NOTE — Progress Notes (Signed)
POSTOPERATIVE DAY # 2 S/P cesarean section   S:         Reports feeling well             Tolerating po intake / no nausea / no vomiting / +flatus / no BM             Bleeding is light             Pain controlled with motrin and percocet             Up ad lib / ambulatory/ voiding QS  Newborn breastfeeding    O:  VS: BP 120/71  Pulse 72  Temp 98.6 F (37 C) (Oral)  Resp 18  Ht 5' 3.5" (1.613 m)  Wt 74.571 kg (164 lb 6.4 oz)  BMI 28.67 kg/m2  SpO2 97%  LMP 02/29/2012  Breastfeeding? Unknown   LABS:  Basename 11/30/12 0535 11/29/12 0540  WBC 14.5* 15.1*  HGB 9.0* 12.8  PLT 132* 168               Physical Exam:             Alert and Oriented X3  Lungs: Clear and unlabored  Heart: regular rate and rhythm / no mumurs  Abdomen: soft, non-tender, non-distended, active BS             Fundus: firm, non-tender, U-1             Dressing honeycomb intact               Incision:  approximated with staples / no erythema / mild ecchymosis below incision / dried scant drainage  Perineum: no edema  Lochia: light  Extremities: trace edema, no calf pain or tenderness  A:        POD # 2 S/P cesarean section            ABL anemia - postoperative  P:        Routine postoperative care             Continue iron  Addendum = spouse requesting discharge today if possible.     Marlinda Mike CNM, MSN 12/01/2012, 8:37 AM

## 2013-06-17 ENCOUNTER — Other Ambulatory Visit (HOSPITAL_COMMUNITY)
Admission: RE | Admit: 2013-06-17 | Discharge: 2013-06-17 | Disposition: A | Discharge status: Home / Self Care | Payer: 59 | Source: Ambulatory Visit | Attending: Gynecology | Admitting: Gynecology

## 2013-06-17 ENCOUNTER — Encounter: Payer: Self-pay | Admitting: Women's Health

## 2013-06-17 ENCOUNTER — Ambulatory Visit (INDEPENDENT_AMBULATORY_CARE_PROVIDER_SITE_OTHER): Payer: 59 | Admitting: Women's Health

## 2013-06-17 VITALS — BP 112/70 | Ht 63.5 in | Wt 140.0 lb

## 2013-06-17 DIAGNOSIS — N879 Dysplasia of cervix uteri, unspecified: Secondary | ICD-10-CM

## 2013-06-17 DIAGNOSIS — F329 Major depressive disorder, single episode, unspecified: Secondary | ICD-10-CM

## 2013-06-17 DIAGNOSIS — F411 Generalized anxiety disorder: Secondary | ICD-10-CM

## 2013-06-17 DIAGNOSIS — Z01419 Encounter for gynecological examination (general) (routine) without abnormal findings: Secondary | ICD-10-CM | POA: Insufficient documentation

## 2013-06-17 LAB — CBC WITH DIFFERENTIAL/PLATELET
Eosinophils Absolute: 0.1 10*3/uL (ref 0.0–0.7)
Lymphs Abs: 2.1 10*3/uL (ref 0.7–4.0)
MCH: 30.5 pg (ref 26.0–34.0)
Neutrophils Relative %: 57 % (ref 43–77)
Platelets: 252 10*3/uL (ref 150–400)
RBC: 4.46 MIL/uL (ref 3.87–5.11)
WBC: 6.2 10*3/uL (ref 4.0–10.5)

## 2013-06-17 LAB — URINALYSIS W MICROSCOPIC + REFLEX CULTURE
Casts: NONE SEEN
Hgb urine dipstick: NEGATIVE
Leukocytes, UA: NEGATIVE
Nitrite: NEGATIVE
Protein, ur: NEGATIVE mg/dL
Urobilinogen, UA: 0.2 mg/dL (ref 0.0–1.0)
pH: 7.5 (ref 5.0–8.0)

## 2013-06-17 MED ORDER — SERTRALINE HCL 50 MG PO TABS: ORAL_TABLET | ORAL | Status: DC

## 2013-06-17 NOTE — Patient Instructions (Addendum)

## 2013-06-17 NOTE — Progress Notes (Signed)
TIENA MANANSALA 11-22-1975 [id number]    History:    The patient presents for annual exam.  Regular monthly cycle currently using condoms requesting contraception. Does not desire more children. History of CIN-2 and 3 2002 with a LEEP 2002. History of cryo- 2010. Pap 04/2012 with LGSIL with negative HR HPV. Delivered a son [name] January 2014 by C-section/Dr.Taavon. Has had problems with anxiety and depression in the past on Zoloft doing well.   Past medical history, past surgical history, family history and social history were all reviewed and documented in the EPIC chart. Works in [employer], sports. Father hypertension.   ROS:  A  ROS was performed and pertinent positives and negatives are included in the history.  Exam:  Filed Vitals:   06/17/13 0806  BP: 112/70    General appearance:  Normal Head/Neck:  Normal, without cervical or supraclavicular adenopathy. Thyroid:  Symmetrical, normal in size, without palpable masses or nodularity. Respiratory  Effort:  Normal  Auscultation:  Clear without wheezing or rhonchi Cardiovascular  Auscultation:  Regular rate, without rubs, murmurs or gallops  Edema/varicosities:  Not grossly evident Abdominal  Soft,nontender, without masses, guarding or rebound.  Liver/spleen:  No organomegaly noted  Hernia:  None appreciated  Skin  Inspection:  Grossly normal  Palpation:  Grossly normal Neurologic/psychiatric  Orientation:  Normal with appropriate conversation.  Mood/affect:  Normal  Genitourinary    Breasts: Examined lying and sitting.     Right: Without masses, retractions, discharge or axillary adenopathy.     Left: Without masses, retractions, discharge or axillary adenopathy.   Inguinal/mons:  Normal without inguinal adenopathy  External genitalia:  Normal  BUS/Urethra/Skene's glands:  Normal  Bladder:  Normal  Vagina:  Normal  Cervix:  Normal  Uterus:   normal in size, shape and contour.  Midline and mobile  Adnexa/parametria:      Rt: Without masses or tenderness.   Lt: Without masses or tenderness.  Anus and perineum: Normal  Digital rectal exam: Normal sphincter tone without palpated masses or tenderness  Assessment/Plan:  37 y.o. MWF G1P1 for annual exam.    Contraception management Anxiety/depression stable on Zoloft LEEP 2002 for CIN-2 cryo- 2010  Plan:.contraception options reviewed prefers hormone free, Mirena IUD reviewed, reviewed minimal systemic absorption, condoms in the meanwhile. Reviewed if desires will return to office for Dr. Audie Box to place with cycle. SBE's, continue regular exercise, calcium rich diet, MVI daily. Zoloft 50 by mouth daily prescription proper use given and reviewed importance of leisure and exercise. CBC, UA, Pap.    Harrington Challenger [facility], 10:30 AM 06/17/2013

## 2013-06-18 ENCOUNTER — Other Ambulatory Visit: Payer: Self-pay | Admitting: Gynecology

## 2013-06-18 ENCOUNTER — Telehealth: Payer: Self-pay | Admitting: Gynecology

## 2013-06-18 DIAGNOSIS — Z3049 Encounter for surveillance of other contraceptives: Secondary | ICD-10-CM

## 2013-06-18 MED ORDER — LEVONORGESTREL 20 MCG/24HR IU IUD: INTRAUTERINE_SYSTEM | Freq: Once | INTRAUTERINE | Status: DC

## 2013-06-18 NOTE — Telephone Encounter (Signed)
06/18/13-LM VM for patient stating her insurance covers the Mirena & insertion at 100%, no copay. She will call when on menses to schedule with TF/WL

## 2013-11-25 ENCOUNTER — Telehealth: Payer: Self-pay

## 2013-11-25 ENCOUNTER — Other Ambulatory Visit: Payer: Self-pay | Admitting: Women's Health

## 2013-11-25 MED ORDER — NORETHIN ACE-ETH ESTRAD-FE 1-20 MG-MCG PO TABS: 1.0000 | ORAL_TABLET | Freq: Every day | ORAL | Status: DC

## 2013-11-25 NOTE — Telephone Encounter (Signed)
Telephone call, states would like to try birth control pills. Has used in the past without a problem. Loestrin 1/20 will call in. Instructed to start first day of next cycle or Sunday after cycle starting. Take daily first month not contraceptive protective refills through August.

## 2013-11-25 NOTE — Telephone Encounter (Signed)
Patient said when she saw you in August she did not want birth control pills. She has since changed her mind and would like Rx called in. RX?

## 2013-11-25 NOTE — Telephone Encounter (Signed)
Message left

## 2013-12-13 ENCOUNTER — Ambulatory Visit: Payer: BC Managed Care – PPO | Admitting: Physician Assistant

## 2013-12-13 VITALS — BP 120/82 | HR 64 | Temp 98.8°F | Resp 16 | Ht 62.5 in | Wt 136.6 lb

## 2013-12-13 DIAGNOSIS — J069 Acute upper respiratory infection, unspecified: Secondary | ICD-10-CM

## 2013-12-13 DIAGNOSIS — B9789 Other viral agents as the cause of diseases classified elsewhere: Principal | ICD-10-CM

## 2013-12-13 MED ORDER — HYDROCODONE-HOMATROPINE 5-1.5 MG/5ML PO SYRP: 5.0000 mL | ORAL_SOLUTION | Freq: Three times a day (TID) | ORAL | Status: DC | PRN

## 2013-12-13 MED ORDER — FIRST-DUKES MOUTHWASH MT SUSP: 10.0000 mL | OROMUCOSAL | Status: DC | PRN

## 2013-12-13 MED ORDER — BENZONATATE 100 MG PO CAPS: 100.0000 mg | ORAL_CAPSULE | Freq: Three times a day (TID) | ORAL | Status: DC | PRN

## 2013-12-13 MED ORDER — IPRATROPIUM BROMIDE 0.03 % NA SOLN: 2.0000 | Freq: Two times a day (BID) | NASAL | Status: DC

## 2013-12-13 NOTE — Patient Instructions (Signed)
Atrovent nasal spray 2-3 times  daily for relief of nasal congestion and post-nasal drainage.  Tessalon Perles every 8 hours as needed for cough, and Hycodan for cough at night time (Hycodan will make you sleepy)  Use the magic mouthwash as frequently as every 2 hours as needed for sore throat  Plenty of fluids (water is best!) and rest.  If worsening (fever >101.40F, worsening cough, shortness of breath, worsening pain) or not improving, please let us know.     Upper Respiratory Infection, Adult An upper respiratory infection (URI) is also sometimes known as the common cold. The upper respiratory tract includes the nose, sinuses, throat, trachea, and bronchi. Bronchi are the airways leading to the lungs. Most people improve within 1 week, but symptoms can last up to 2 weeks. A residual cough may last even longer.  CAUSES Many different viruses can infect the tissues lining the upper respiratory tract. The tissues become irritated and inflamed and often become very moist. Mucus production is also common. A cold is contagious. You can easily spread the virus to others by oral contact. This includes kissing, sharing a glass, coughing, or sneezing. Touching your mouth or nose and then touching a surface, which is then touched by another person, can also spread the virus. SYMPTOMS  Symptoms typically develop 1 to 3 days after you come in contact with a cold virus. Symptoms vary from person to person. They may include:  Runny nose.  Sneezing.  Nasal congestion.  Sinus irritation.  Sore throat.  Loss of voice (laryngitis).  Cough.  Fatigue.  Muscle aches.  Loss of appetite.  Headache.  Low-grade fever. DIAGNOSIS  You might diagnose your own cold based on familiar symptoms, since most people get a cold 2 to 3 times a year. Your caregiver can confirm this based on your exam. Most importantly, your caregiver can check that your symptoms are not due to another disease such as strep  throat, sinusitis, pneumonia, asthma, or epiglottitis. Blood tests, throat tests, and X-rays are not necessary to diagnose a common cold, but they may sometimes be helpful in excluding other more serious diseases. Your caregiver will decide if any further tests are required. RISKS AND COMPLICATIONS  You may be at risk for a more severe case of the common cold if you smoke cigarettes, have chronic heart disease (such as heart failure) or lung disease (such as asthma), or if you have a weakened immune system. The very young and very old are also at risk for more serious infections. Bacterial sinusitis, middle ear infections, and bacterial pneumonia can complicate the common cold. The common cold can worsen asthma and chronic obstructive pulmonary disease (COPD). Sometimes, these complications can require emergency medical care and may be life-threatening. PREVENTION  The best way to protect against getting a cold is to practice good hygiene. Avoid oral or hand contact with people with cold symptoms. Wash your hands often if contact occurs. There is no clear evidence that vitamin C, vitamin E, echinacea, or exercise reduces the chance of developing a cold. However, it is always recommended to get plenty of rest and practice good nutrition. TREATMENT  Treatment is directed at relieving symptoms. There is no cure. Antibiotics are not effective, because the infection is caused by a virus, not by bacteria. Treatment may include:  Increased fluid intake. Sports drinks offer valuable electrolytes, sugars, and fluids.  Breathing heated mist or steam (vaporizer or shower).  Eating chicken soup or other clear broths, and maintaining good nutrition.  Getting plenty of rest.  Using gargles or lozenges for comfort.  Controlling fevers with ibuprofen or acetaminophen as directed by your caregiver.  Increasing usage of your inhaler if you have asthma. Zinc gel and zinc lozenges, taken in the first 24 hours of  the common cold, can shorten the duration and lessen the severity of symptoms. Pain medicines may help with fever, muscle aches, and throat pain. A variety of non-prescription medicines are available to treat congestion and runny nose. Your caregiver can make recommendations and may suggest nasal or lung inhalers for other symptoms.  HOME CARE INSTRUCTIONS   Only take over-the-counter or prescription medicines for pain, discomfort, or fever as directed by your caregiver.  Use a warm mist humidifier or inhale steam from a shower to increase air moisture. This may keep secretions moist and make it easier to breathe.  Drink enough water and fluids to keep your urine clear or pale yellow.  Rest as needed.  Return to work when your temperature has returned to normal or as your caregiver advises. You may need to stay home longer to avoid infecting others. You can also use a face mask and careful hand washing to prevent spread of the virus. SEEK MEDICAL CARE IF:   After the first few days, you feel you are getting worse rather than better.  You need your caregiver's advice about medicines to control symptoms.  You develop chills, worsening shortness of breath, or brown or red sputum. These may be signs of pneumonia.  You develop yellow or brown nasal discharge or pain in the face, especially when you bend forward. These may be signs of sinusitis.  You develop a fever, swollen neck glands, pain with swallowing, or white areas in the back of your throat. These may be signs of strep throat. SEEK IMMEDIATE MEDICAL CARE IF:   You have a fever.  You develop severe or persistent headache, ear pain, sinus pain, or chest pain.  You develop wheezing, a prolonged cough, cough up blood, or have a change in your usual mucus (if you have chronic lung disease).  You develop sore muscles or a stiff neck. Document Released: 04/23/2001 Document Revised: 01/20/2012 Document Reviewed: 03/01/2011 [facility]  Patient Information 2014 [place], [place].

## 2013-12-13 NOTE — Progress Notes (Signed)
   Subjective:    Patient ID: Marinus MawDena B Humann, female    DOB: 1976-03-03, [age] y.o.   MRN: [id number]  HPI   Ms. Baines is a very pleasant 38 yr old female here with concern for illness.  Reports she came down with a terrible cold 3 days ago.  Symptoms include sinus congestion, nasal drainage, cough, sore throat.  She denies fever or chills.  Cough is minimally productive but is keeping her awake at night.  Everybody at work has been sick with similar symptoms.  Has used otc cold prep with relief of symptoms at night due to sedating effects of nyquil  Review of Systems  Constitutional: Negative for fever and chills.  HENT: Positive for congestion, ear pain, rhinorrhea, sinus pressure and sore throat.   Respiratory: Positive for cough. Negative for shortness of breath and wheezing.   Cardiovascular: Negative.   Gastrointestinal: Negative for nausea, vomiting and abdominal pain.  Musculoskeletal: Negative for arthralgias and myalgias.       Objective:   Physical Exam  Vitals reviewed. Constitutional: She is oriented to person, place, and time. She appears well-developed and well-nourished. No distress.  HENT:  Head: Normocephalic and atraumatic.  Right Ear: Tympanic membrane and ear canal normal.  Left Ear: Tympanic membrane and ear canal normal.  Nose: Mucosal edema and rhinorrhea present. Right sinus exhibits no maxillary sinus tenderness and no frontal sinus tenderness. Left sinus exhibits no maxillary sinus tenderness and no frontal sinus tenderness.  Mouth/Throat: Uvula is midline, oropharynx is clear and moist and mucous membranes are normal.  Eyes: Conjunctivae are normal. No scleral icterus.  Neck: Neck supple.  Cardiovascular: Normal rate, regular rhythm and normal heart sounds.   Pulmonary/Chest: Effort normal and breath sounds normal. She has no wheezes. She has no rales.  Abdominal: Soft. There is no tenderness.  Lymphadenopathy:    She has no cervical adenopathy.  Neurological:  She is alert and oriented to person, place, and time.  Skin: Skin is warm and dry.  Psychiatric: She has a normal mood and affect. Her behavior is normal.       Assessment & Plan:  Viral URI with cough - Plan: HYDROcodone-homatropine (HYCODAN) 5-1.5 MG/5ML syrup, ipratropium (ATROVENT) 0.03 % nasal spray, Diphenhyd-Hydrocort-Nystatin (FIRST-DUKES MOUTHWASH) SUSP, benzonatate (TESSALON) 100 MG capsule   Ms. Kolodziej is a very pleasant 38 yr old female with 3 days of URI.  Suspect viral etiology.  She is afebrile, lungs CTA, throat clear, sinuses non tender.  Will treat symptomatically with atrovent ,tessalon, hycodan.  Push fluids, rest.  Good hand hygiene.  Call or RTC if worsening or not improving  E. Frances FurbishElizabeth Kemyah Buser MHS, PA-C [facility] 2/2/20158:00 PM

## 2013-12-17 ENCOUNTER — Telehealth: Payer: Self-pay | Admitting: Physician Assistant

## 2013-12-17 ENCOUNTER — Telehealth: Payer: Self-pay | Admitting: Radiology

## 2013-12-17 DIAGNOSIS — J069 Acute upper respiratory infection, unspecified: Secondary | ICD-10-CM

## 2013-12-17 DIAGNOSIS — B9789 Other viral agents as the cause of diseases classified elsewhere: Principal | ICD-10-CM

## 2013-12-17 MED ORDER — HYDROCODONE-HOMATROPINE 5-1.5 MG/5ML PO SYRP: 5.0000 mL | ORAL_SOLUTION | Freq: Three times a day (TID) | ORAL | Status: DC | PRN

## 2013-12-17 MED ORDER — AZITHROMYCIN 250 MG PO TABS: ORAL_TABLET | ORAL | Status: DC

## 2013-12-17 NOTE — Telephone Encounter (Signed)
I have spoken with Hannah Flores about this. I have printed rx for cough medicine.  Pt will need to pick up.  Zpak sent to pharmacy.  I do think it would be reasonable for pt to hold off on antibiotic for a few more days as this is still likely viral and cough tends to persist for a week or so after other symptoms subside.  But she will have the medicine should she want to start it

## 2013-12-17 NOTE — Telephone Encounter (Signed)
Notified pt ready and discussed Abx instr's.

## 2013-12-17 NOTE — Telephone Encounter (Signed)
Please call patient and see how she is feeling

## 2013-12-17 NOTE — Telephone Encounter (Signed)
Patient called our office because she was instructed by Rhoderick MoodyHeather Marte (spoke with each other yesterday) to call if she had not heard back from us today.  Per patient, Herbert SetaHeather was going to send in an antibiotic (z-pak) and a cough medication.  I do not see any documentation regarding this.

## 2014-07-07 ENCOUNTER — Encounter: Payer: Self-pay | Admitting: Women's Health

## 2014-07-07 ENCOUNTER — Other Ambulatory Visit (HOSPITAL_COMMUNITY)
Admission: RE | Admit: 2014-07-07 | Discharge: 2014-07-07 | Disposition: A | Discharge status: Home / Self Care | Payer: BC Managed Care – PPO | Source: Ambulatory Visit | Attending: Gynecology | Admitting: Gynecology

## 2014-07-07 ENCOUNTER — Ambulatory Visit (INDEPENDENT_AMBULATORY_CARE_PROVIDER_SITE_OTHER): Payer: BC Managed Care – PPO | Admitting: Women's Health

## 2014-07-07 VITALS — BP 116/74 | Ht 63.25 in | Wt 132.2 lb

## 2014-07-07 DIAGNOSIS — F329 Major depressive disorder, single episode, unspecified: Secondary | ICD-10-CM

## 2014-07-07 DIAGNOSIS — Z01419 Encounter for gynecological examination (general) (routine) without abnormal findings: Secondary | ICD-10-CM

## 2014-07-07 DIAGNOSIS — Z3041 Encounter for surveillance of contraceptive pills: Secondary | ICD-10-CM

## 2014-07-07 DIAGNOSIS — F3289 Other specified depressive episodes: Secondary | ICD-10-CM

## 2014-07-07 DIAGNOSIS — F32A Depression, unspecified: Secondary | ICD-10-CM

## 2014-07-07 DIAGNOSIS — E282 Polycystic ovarian syndrome: Secondary | ICD-10-CM

## 2014-07-07 MED ORDER — SPIRONOLACTONE 25 MG PO TABS: 25.0000 mg | ORAL_TABLET | Freq: Every day | ORAL | Status: DC

## 2014-07-07 MED ORDER — LEVONORGESTREL-ETHINYL ESTRAD 0.1-20 MG-MCG PO TABS: 1.0000 | ORAL_TABLET | Freq: Every day | ORAL | Status: DC

## 2014-07-07 MED ORDER — SERTRALINE HCL 50 MG PO TABS: ORAL_TABLET | ORAL | Status: DC

## 2014-07-07 NOTE — Patient Instructions (Signed)

## 2014-07-07 NOTE — Progress Notes (Signed)
Hannah Flores 11-Apr-1976 [id number]  History:    Presents for annual exam.  Light monthly cycle on Loestrin. Anxiety and depression stable on Zoloft but states has not felt as well on Loestrin as she did on Ortho Tri-Cyclen. Requests lower dose contraception. 2002 CIN-3 with a LEEP normal Paps after, 2010 CIN-1 with cryo- normal Paps after, Pap 2013 LGSIL with negative HR HPV, 2014 normal.  Past medical history, past surgical history, family history and social history were all reviewed and documented in the EPIC chart. Works at [employer]. Son is 19 months doing well.  ROS:  A  12 point ROS was performed and pertinent positives and negatives are included.  Exam:  Filed Vitals:   07/07/14 0757  BP: 116/74    General appearance:  Normal Thyroid:  Symmetrical, normal in size, without palpable masses or nodularity. Respiratory  Auscultation:  Clear without wheezing or rhonchi Cardiovascular  Auscultation:  Regular rate, without rubs, murmurs or gallops  Edema/varicosities:  Not grossly evident Abdominal  Soft,nontender, without masses, guarding or rebound.  Liver/spleen:  No organomegaly noted  Hernia:  None appreciated  Skin  Inspection:  Grossly normal   Breasts: Examined lying and sitting.     Right: Without masses, retractions, discharge or axillary adenopathy.     Left: Without masses, retractions, discharge or axillary adenopathy. Gentitourinary   Inguinal/mons:  Normal without inguinal adenopathy  External genitalia:  Normal  BUS/Urethra/Skene's glands:  Normal  Vagina:  Normal  Cervix:  Normal  Uterus:   normal in size, shape and contour.  Midline and mobile  Adnexa/parametria:     Rt: Without masses or tenderness.   Lt: Without masses or tenderness.  Anus and perineum: Normal  Digital rectal exam: Normal sphincter tone without palpated masses or tenderness  Assessment/Plan:  37 y.o.MWF G1P1  for annual exam.     Contraception management Spironolactone  acne Anxiety/depression stable on Zoloft  Plan: Counseling reviewed denies need at this time, has had in the past. Zoloft 75 mg daily prescription, proper use given and reviewed. SBE's, regular exercise, calcium rich diet, MVI daily encouraged. Contraception options reviewed will try Alesse prescription, proper use given and reviewed slight risk for blood clots and strokes, instructed to call if does not like change. Spironolactone 25 mg by mouth daily prescription, proper use given and reviewed. CBC, TSH, UA, Pap. New screening guidelines reviewed.   Note: This dictation was prepared with Dragon/digital dictation.  Any transcriptional errors that result are unintentional. Harrington Challenger [facility], 9:15 AM 07/07/2014

## 2014-07-08 ENCOUNTER — Other Ambulatory Visit: Payer: Self-pay | Admitting: Women's Health

## 2014-07-08 LAB — URINALYSIS W MICROSCOPIC + REFLEX CULTURE
Bacteria, UA: NONE SEEN
Bilirubin Urine: NEGATIVE
CASTS: NONE SEEN
CRYSTALS: NONE SEEN
Glucose, UA: NEGATIVE mg/dL
Ketones, ur: NEGATIVE mg/dL
Leukocytes, UA: NEGATIVE
NITRITE: NEGATIVE
PH: 6.5 (ref 5.0–8.0)
Protein, ur: NEGATIVE mg/dL
SQUAMOUS EPITHELIAL / LPF: NONE SEEN
Urobilinogen, UA: 0.2 mg/dL (ref 0.0–1.0)

## 2014-07-08 LAB — CYTOLOGY - PAP

## 2014-07-11 LAB — URINE CULTURE: Colony Count: 30000

## 2014-09-12 ENCOUNTER — Encounter: Payer: Self-pay | Admitting: Women's Health

## 2015-07-12 ENCOUNTER — Encounter: Payer: Self-pay | Admitting: Women's Health

## 2015-07-12 ENCOUNTER — Other Ambulatory Visit (HOSPITAL_COMMUNITY)
Admission: RE | Admit: 2015-07-12 | Discharge: 2015-07-12 | Disposition: A | Discharge status: Home / Self Care | Payer: BLUE CROSS/BLUE SHIELD | Source: Ambulatory Visit | Attending: Women's Health | Admitting: Women's Health

## 2015-07-12 ENCOUNTER — Ambulatory Visit (INDEPENDENT_AMBULATORY_CARE_PROVIDER_SITE_OTHER): Payer: BLUE CROSS/BLUE SHIELD | Admitting: Women's Health

## 2015-07-12 VITALS — BP 128/80 | Ht 63.0 in | Wt 139.0 lb

## 2015-07-12 DIAGNOSIS — Z1151 Encounter for screening for human papillomavirus (HPV): Secondary | ICD-10-CM | POA: Diagnosis not present

## 2015-07-12 DIAGNOSIS — F329 Major depressive disorder, single episode, unspecified: Secondary | ICD-10-CM | POA: Diagnosis not present

## 2015-07-12 DIAGNOSIS — F32A Depression, unspecified: Secondary | ICD-10-CM

## 2015-07-12 DIAGNOSIS — E282 Polycystic ovarian syndrome: Secondary | ICD-10-CM | POA: Diagnosis not present

## 2015-07-12 DIAGNOSIS — Z01419 Encounter for gynecological examination (general) (routine) without abnormal findings: Secondary | ICD-10-CM | POA: Diagnosis not present

## 2015-07-12 LAB — COMPREHENSIVE METABOLIC PANEL
ALK PHOS: 48 U/L (ref 33–115)
ALT: 18 U/L (ref 6–29)
AST: 15 U/L (ref 10–30)
Albumin: 4.2 g/dL (ref 3.6–5.1)
BUN: 11 mg/dL (ref 7–25)
CALCIUM: 9.4 mg/dL (ref 8.6–10.2)
CO2: 23 mmol/L (ref 20–31)
Chloride: 102 mmol/L (ref 98–110)
Creat: 0.75 mg/dL (ref 0.50–1.10)
Glucose, Bld: 73 mg/dL (ref 65–99)
Potassium: 4.2 mmol/L (ref 3.5–5.3)
Sodium: 139 mmol/L (ref 135–146)
Total Bilirubin: 0.4 mg/dL (ref 0.2–1.2)
Total Protein: 6.6 g/dL (ref 6.1–8.1)

## 2015-07-12 LAB — CBC WITH DIFFERENTIAL/PLATELET
BASOS ABS: 0 10*3/uL (ref 0.0–0.1)
Basophils Relative: 0 % (ref 0–1)
EOS PCT: 1 % (ref 0–5)
Eosinophils Absolute: 0.1 10*3/uL (ref 0.0–0.7)
HEMATOCRIT: 33.5 % — AB (ref 36.0–46.0)
Hemoglobin: 10.9 g/dL — ABNORMAL LOW (ref 12.0–15.0)
LYMPHS ABS: 1.9 10*3/uL (ref 0.7–4.0)
LYMPHS PCT: 27 % (ref 12–46)
MCH: 25.6 pg — ABNORMAL LOW (ref 26.0–34.0)
MCHC: 32.5 g/dL (ref 30.0–36.0)
MCV: 78.8 fL (ref 78.0–100.0)
MONOS PCT: 8 % (ref 3–12)
MPV: 10.7 fL (ref 8.6–12.4)
Monocytes Absolute: 0.6 10*3/uL (ref 0.1–1.0)
Neutro Abs: 4.4 10*3/uL (ref 1.7–7.7)
Neutrophils Relative %: 64 % (ref 43–77)
Platelets: 223 10*3/uL (ref 150–400)
RBC: 4.25 MIL/uL (ref 3.87–5.11)
RDW: 14.8 % (ref 11.5–15.5)
WBC: 6.9 10*3/uL (ref 4.0–10.5)

## 2015-07-12 LAB — LIPID PANEL
Cholesterol: 181 mg/dL (ref 125–200)
HDL: 77 mg/dL (ref >=46)
LDL Cholesterol: 81 mg/dL (ref <130)
Total CHOL/HDL Ratio: 2.4 Ratio (ref <=5.0)
Triglycerides: 115 mg/dL (ref <150)
VLDL: 23 mg/dL (ref <30)

## 2015-07-12 MED ORDER — SPIRONOLACTONE 25 MG PO TABS: 25.0000 mg | ORAL_TABLET | Freq: Every day | ORAL | Status: DC

## 2015-07-12 MED ORDER — SERTRALINE HCL 50 MG PO TABS: ORAL_TABLET | ORAL | Status: DC

## 2015-07-12 NOTE — Patient Instructions (Signed)

## 2015-07-12 NOTE — Progress Notes (Signed)
Hannah Flores 11-06-1976 [id number]    History:    Presents for annual exam. Regular monthly cycle/condoms. 2002 CIN-3 LEEP, 2010 CIN-1 cryo, 2013 LGSIL with negative HR HPV, 2014 and 15 normal Paps. On Spironolactone  for acne with good relief. Zoloft 75 for anxiety and depression stable.  Past medical history, past surgical history, family history and social history were all reviewed and documented in the EPIC chart. 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 Has had a stressful year lives in a condo, elderly couple live below that want total quiet, husband opiod addiction, went to rehabilitation, doing better.  ROS:  A ROS was performed and pertinent positives and negatives are included.  Exam:  Filed Vitals:   07/12/15 0803  BP: 128/80    General appearance:  Normal Thyroid:  Symmetrical, normal in size, without palpable masses or nodularity. Respiratory  Auscultation:  Clear without wheezing or rhonchi Cardiovascular  Auscultation:  Regular rate, without rubs, murmurs or gallops  Edema/varicosities:  Not grossly evident Abdominal  Soft,nontender, without masses, guarding or rebound.  Liver/spleen:  No organomegaly noted  Hernia:  None appreciated  Skin  Inspection:  Grossly normal   Breasts: Examined lying and sitting.     Right: Without masses, retractions, discharge or axillary adenopathy.     Left: Without masses, retractions, discharge or axillary adenopathy. Gentitourinary   Inguinal/mons:  Normal without inguinal adenopathy  External genitalia:  Normal  BUS/Urethra/Skene's glands:  Normal  Vagina:  Normal  Cervix:  Normal  Uterus:   normal in size, shape and contour.  Midline and mobile  Adnexa/parametria:     Rt: Without masses or tenderness.   Lt: Without masses or tenderness.  Anus and perineum: Normal  Digital rectal exam: Normal sphincter tone without palpated masses or tenderness  Assessment/Plan:  39 y.o. MWF G1P1 for annual exam.     Regular monthly cycle/condoms 2002 CIN-3 LEEP / 2010 CIN 1 cryo Anxiety/depression stable on Zoloft Acne-spironolactone   Plan: Contraception reviewed and declines will continue condoms. SBE's, continue regular exercise, calcium rich diet, MVI daily encouraged. Not planning second pregnancy at this time. Zoloft 75 mg by mouth daily prescription, proper use given and reviewed and encouraged to continue counseling as needed. CBC, lipid panel, TSH, UA, Pap with HR HPV typing. Spironolactone 50 mg by mouth daily prescription, proper use given and reviewed.  Harrington Challenger [facility], 8:43 AM 07/12/2015

## 2015-07-13 LAB — URINALYSIS W MICROSCOPIC + REFLEX CULTURE
Bilirubin Urine: NEGATIVE
Casts: NONE SEEN [LPF]
Crystals: NONE SEEN [HPF]
GLUCOSE, UA: NEGATIVE
KETONES UR: NEGATIVE
LEUKOCYTES UA: NEGATIVE
Nitrite: NEGATIVE
PH: 7 (ref 5.0–8.0)
Protein, ur: NEGATIVE
Specific Gravity, Urine: 1.003 (ref 1.001–1.035)
WBC UA: NONE SEEN WBC/HPF (ref <=5)
Yeast: NONE SEEN [HPF]

## 2015-07-13 LAB — CYTOLOGY - PAP

## 2015-07-14 ENCOUNTER — Other Ambulatory Visit: Payer: Self-pay | Admitting: Women's Health

## 2015-07-14 MED ORDER — SULFAMETHOXAZOLE-TRIMETHOPRIM 800-160 MG PO TABS: 1.0000 | ORAL_TABLET | Freq: Two times a day (BID) | ORAL | Status: DC

## 2015-07-14 MED ORDER — FLUCONAZOLE 150 MG PO TABS: 150.0000 mg | ORAL_TABLET | Freq: Once | ORAL | Status: DC

## 2015-07-15 LAB — URINE CULTURE

## 2015-07-24 ENCOUNTER — Other Ambulatory Visit: Payer: Self-pay | Admitting: Women's Health

## 2015-07-24 MED ORDER — CIPROFLOXACIN HCL 250 MG PO TABS: 250.0000 mg | ORAL_TABLET | Freq: Two times a day (BID) | ORAL | Status: DC

## 2015-09-18 ENCOUNTER — Other Ambulatory Visit: Payer: Self-pay

## 2015-09-18 DIAGNOSIS — E282 Polycystic ovarian syndrome: Secondary | ICD-10-CM

## 2015-09-18 MED ORDER — SPIRONOLACTONE 25 MG PO TABS: 25.0000 mg | ORAL_TABLET | Freq: Every day | ORAL | Status: DC

## 2015-09-26 ENCOUNTER — Other Ambulatory Visit: Payer: Self-pay

## 2015-09-26 DIAGNOSIS — F329 Major depressive disorder, single episode, unspecified: Secondary | ICD-10-CM

## 2015-09-26 DIAGNOSIS — F32A Depression, unspecified: Secondary | ICD-10-CM

## 2015-09-26 MED ORDER — SERTRALINE HCL 50 MG PO TABS: ORAL_TABLET | ORAL | Status: DC

## 2016-05-31 DIAGNOSIS — L539 Erythematous condition, unspecified: Secondary | ICD-10-CM | POA: Diagnosis not present

## 2016-07-12 ENCOUNTER — Ambulatory Visit (INDEPENDENT_AMBULATORY_CARE_PROVIDER_SITE_OTHER): Payer: BLUE CROSS/BLUE SHIELD | Admitting: Women's Health

## 2016-07-12 ENCOUNTER — Encounter: Payer: Self-pay | Admitting: Women's Health

## 2016-07-12 VITALS — BP 124/80 | Ht 64.0 in | Wt 144.0 lb

## 2016-07-12 DIAGNOSIS — F32A Depression, unspecified: Secondary | ICD-10-CM

## 2016-07-12 DIAGNOSIS — Z1329 Encounter for screening for other suspected endocrine disorder: Secondary | ICD-10-CM

## 2016-07-12 DIAGNOSIS — E282 Polycystic ovarian syndrome: Secondary | ICD-10-CM

## 2016-07-12 DIAGNOSIS — F329 Major depressive disorder, single episode, unspecified: Secondary | ICD-10-CM

## 2016-07-12 DIAGNOSIS — Z01419 Encounter for gynecological examination (general) (routine) without abnormal findings: Secondary | ICD-10-CM | POA: Diagnosis not present

## 2016-07-12 LAB — URINALYSIS W MICROSCOPIC + REFLEX CULTURE
BACTERIA UA: NONE SEEN [HPF]
BILIRUBIN URINE: NEGATIVE
CRYSTALS: NONE SEEN [HPF]
Casts: NONE SEEN [LPF]
Glucose, UA: NEGATIVE
Hgb urine dipstick: NEGATIVE
KETONES UR: NEGATIVE
Leukocytes, UA: NEGATIVE
Nitrite: NEGATIVE
PROTEIN: NEGATIVE
RBC / HPF: NONE SEEN RBC/HPF (ref <=2)
SQUAMOUS EPITHELIAL / LPF: NONE SEEN [HPF] (ref <=5)
Specific Gravity, Urine: 1.005 (ref 1.001–1.035)
WBC UA: NONE SEEN WBC/HPF (ref <=5)
Yeast: NONE SEEN [HPF]
pH: 7 (ref 5.0–8.0)

## 2016-07-12 MED ORDER — SERTRALINE HCL 50 MG PO TABS: ORAL_TABLET | ORAL | 4 refills | Status: DC

## 2016-07-12 MED ORDER — SPIRONOLACTONE 25 MG PO TABS: 25.0000 mg | ORAL_TABLET | Freq: Every day | ORAL | 4 refills | Status: DC

## 2016-07-12 NOTE — Patient Instructions (Signed)
Breast center [phone]  Health Maintenance, Female Adopting a healthy lifestyle and getting preventive care can go a long way to promote health and wellness. Talk with your health care provider about what schedule of regular examinations is right for you. This is a good chance for you to check in with your provider about disease prevention and staying healthy. In between checkups, there are plenty of things you can do on your own. Experts have done a lot of research about which lifestyle changes and preventive measures are most likely to keep you healthy. Ask your health care provider for more information. WEIGHT AND DIET  Eat a healthy diet  Be sure to include plenty of vegetables, fruits, low-fat dairy products, and lean protein.  Do not eat a lot of foods high in solid fats, added sugars, or salt.  Get regular exercise. This is one of the most important things you can do for your health.  Most adults should exercise for at least 150 minutes each week. The exercise should increase your heart rate and make you sweat (moderate-intensity exercise).  Most adults should also do strengthening exercises at least twice a week. This is in addition to the moderate-intensity exercise.  Maintain a healthy weight  Body mass index (BMI) is a measurement that can be used to identify possible weight problems. It estimates body fat based on height and weight. Your health care provider can help determine your BMI and help you achieve or maintain a healthy weight.  For females 20 years of age and older:   A BMI below 18.5 is considered underweight.  A BMI of 18.5 to 24.9 is normal.  A BMI of 25 to 29.9 is considered overweight.  A BMI of 30 and above is considered obese.  Watch levels of cholesterol and blood lipids  You should start having your blood tested for lipids and cholesterol at 40 years of age, then have this test every 5 years.  You may need to have your cholesterol levels checked  more often if:  Your lipid or cholesterol levels are high.  You are older than 40 years of age.  You are at high risk for heart disease.  CANCER SCREENING   Lung Cancer  Lung cancer screening is recommended for adults 55-80 years old who are at high risk for lung cancer because of a history of smoking.  A yearly low-dose CT scan of the lungs is recommended for people who:  Currently smoke.  Have quit within the past 15 years.  Have at least a 30-pack-year history of smoking. A pack year is smoking an average of one pack of cigarettes a day for 1 year.  Yearly screening should continue until it has been 15 years since you quit.  Yearly screening should stop if you develop a health problem that would prevent you from having lung cancer treatment.  Breast Cancer  Practice breast self-awareness. This means understanding how your breasts normally appear and feel.  It also means doing regular breast self-exams. Let your health care provider know about any changes, no matter how small.  If you are in your 20s or 30s, you should have a clinical breast exam (CBE) by a health care provider every 1-3 years as part of a regular health exam.  If you are 40 or older, have a CBE every year. Also consider having a breast X-ray (mammogram) every year.  If you have a family history of breast cancer, talk to your health care provider about genetic   screening.  If you are at high risk for breast cancer, talk to your health care provider about having an MRI and a mammogram every year.  Breast cancer gene (BRCA) assessment is recommended for women who have family members with BRCA-related cancers. BRCA-related cancers include:  Breast.  Ovarian.  Tubal.  Peritoneal cancers.  Results of the assessment will determine the need for genetic counseling and BRCA1 and BRCA2 testing. Cervical Cancer Your health care provider may recommend that you be screened regularly for cancer of the pelvic  organs (ovaries, uterus, and vagina). This screening involves a pelvic examination, including checking for microscopic changes to the surface of your cervix (Pap test). You may be encouraged to have this screening done every 3 years, beginning at age 21.  For women ages 30-65, health care providers may recommend pelvic exams and Pap testing every 3 years, or they may recommend the Pap and pelvic exam, combined with testing for human papilloma virus (HPV), every 5 years. Some types of HPV increase your risk of cervical cancer. Testing for HPV may also be done on women of any age with unclear Pap test results.  Other health care providers may not recommend any screening for nonpregnant women who are considered low risk for pelvic cancer and who do not have symptoms. Ask your health care provider if a screening pelvic exam is right for you.  If you have had past treatment for cervical cancer or a condition that could lead to cancer, you need Pap tests and screening for cancer for at least 20 years after your treatment. If Pap tests have been discontinued, your risk factors (such as having a new sexual partner) need to be reassessed to determine if screening should resume. Some women have medical problems that increase the chance of getting cervical cancer. In these cases, your health care provider may recommend more frequent screening and Pap tests. Colorectal Cancer  This type of cancer can be detected and often prevented.  Routine colorectal cancer screening usually begins at 40 years of age and continues through 40 years of age.  Your health care provider may recommend screening at an earlier age if you have risk factors for colon cancer.  Your health care provider may also recommend using home test kits to check for hidden blood in the stool.  A small camera at the end of a tube can be used to examine your colon directly (sigmoidoscopy or colonoscopy). This is done to check for the earliest forms  of colorectal cancer.  Routine screening usually begins at age 50.  Direct examination of the colon should be repeated every 5-10 years through 40 years of age. However, you may need to be screened more often if early forms of precancerous polyps or small growths are found. Skin Cancer  Check your skin from head to toe regularly.  Tell your health care provider about any new moles or changes in moles, especially if there is a change in a mole's shape or color.  Also tell your health care provider if you have a mole that is larger than the size of a pencil eraser.  Always use sunscreen. Apply sunscreen liberally and repeatedly throughout the day.  Protect yourself by wearing long sleeves, pants, a wide-brimmed hat, and sunglasses whenever you are outside. HEART DISEASE, DIABETES, AND HIGH BLOOD PRESSURE   High blood pressure causes heart disease and increases the risk of stroke. High blood pressure is more likely to develop in:  People who have blood pressure   in the high end of the normal range (130-139/85-89 mm Hg).  People who are overweight or obese.  People who are African American.  If you are 13-33 years of age, have your blood pressure checked every 3-5 years. If you are 45 years of age or older, have your blood pressure checked every year. You should have your blood pressure measured twice--once when you are at a hospital or clinic, and once when you are not at a hospital or clinic. Record the average of the two measurements. To check your blood pressure when you are not at a hospital or clinic, you can use:  An automated blood pressure machine at a pharmacy.  A home blood pressure monitor.  If you are between 23 years and 90 years old, ask your health care provider if you should take aspirin to prevent strokes.  Have regular diabetes screenings. This involves taking a blood sample to check your fasting blood sugar level.  If you are at a normal weight and have a low risk for  diabetes, have this test once every three years after 40 years of age.  If you are overweight and have a high risk for diabetes, consider being tested at a younger age or more often. PREVENTING INFECTION  Hepatitis B  If you have a higher risk for hepatitis B, you should be screened for this virus. You are considered at high risk for hepatitis B if:  You were born in a country where hepatitis B is common. Ask your health care provider which countries are considered high risk.  Your parents were born in a high-risk country, and you have not been immunized against hepatitis B (hepatitis B vaccine).  You have HIV or AIDS.  You use needles to inject street drugs.  You live with someone who has hepatitis B.  You have had sex with someone who has hepatitis B.  You get hemodialysis treatment.  You take certain medicines for conditions, including cancer, organ transplantation, and autoimmune conditions. Hepatitis C  Blood testing is recommended for:  Everyone born from [birth year] through 1965.  Anyone with known risk factors for hepatitis C. Sexually transmitted infections (STIs)  You should be screened for sexually transmitted infections (STIs) including gonorrhea and chlamydia if:  You are sexually active and are younger than 39 years of age.  You are older than 40 years of age and your health care provider tells you that you are at risk for this type of infection.  Your sexual activity has changed since you were last screened and you are at an increased risk for chlamydia or gonorrhea. Ask your health care provider if you are at risk.  If you do not have HIV, but are at risk, it may be recommended that you take a prescription medicine daily to prevent HIV infection. This is called pre-exposure prophylaxis (PrEP). You are considered at risk if:  You are sexually active and do not regularly use condoms or know the HIV status of your partner(s).  You take drugs by injection.  You are  sexually active with a partner who has HIV. Talk with your health care provider about whether you are at high risk of being infected with HIV. If you choose to begin PrEP, you should first be tested for HIV. You should then be tested every 3 months for as long as you are taking PrEP.  PREGNANCY   If you are premenopausal and you may become pregnant, ask your health care provider about preconception counseling.  If you may become pregnant, take 400 to 800 micrograms (mcg) of folic acid every day.  If you want to prevent pregnancy, talk to your health care provider about birth control (contraception). OSTEOPOROSIS AND MENOPAUSE   Osteoporosis is a disease in which the bones lose minerals and strength with aging. This can result in serious bone fractures. Your risk for osteoporosis can be identified using a bone density scan.  If you are 67 years of age or older, or if you are at risk for osteoporosis and fractures, ask your health care provider if you should be screened.  Ask your health care provider whether you should take a calcium or vitamin D supplement to lower your risk for osteoporosis.  Menopause may have certain physical symptoms and risks.  Hormone replacement therapy may reduce some of these symptoms and risks. Talk to your health care provider about whether hormone replacement therapy is right for you.  HOME CARE INSTRUCTIONS   Schedule regular health, dental, and eye exams.  Stay current with your immunizations.   Do not use any tobacco products including cigarettes, chewing tobacco, or electronic cigarettes.  If you are pregnant, do not drink alcohol.  If you are breastfeeding, limit how much and how often you drink alcohol.  Limit alcohol intake to no more than 1 drink per day for nonpregnant women. One drink equals 12 ounces of beer, 5 ounces of wine, or 1 ounces of hard liquor.  Do not use street drugs.  Do not share needles.  Ask your health care provider for  help if you need support or information about quitting drugs.  Tell your health care provider if you often feel depressed.  Tell your health care provider if you have ever been abused or do not feel safe at home.   This information is not intended to replace advice given to you by your health care provider. Make sure you discuss any questions you have with your health care provider.   Document Released: 05/13/2011 Document Revised: 11/18/2014 Document Reviewed: 09/29/2013 Elsevier Interactive Patient Education [employer].

## 2016-07-12 NOTE — Progress Notes (Signed)
Hannah Flores 1976-04-29 [id number]    History:    Presents for annual exam.  Monthly cycle/condoms. 2002 CIN-3  LEEP, 2010 CIN-1 cryo-, 2013 LGSIL with negative HR HPV, normal Paps after,2016 normal Pap with negative HR HPV. Normal lipid panel, mild anemia last year. Anxiety and depression stable on Zoloft will continue. Aldactone for acne.   Past medical history, past surgical history, family history and social history were all reviewed and documented in the EPIC chart. Reports life has gotten better husband has completed rehabilitation doing well after opioid addiction. Son is 3-1/2 doing well. 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   ROS:  A ROS was performed and pertinent positives and negatives are included.  Exam:  Vitals:   07/12/16 0817  BP: 124/80  Weight: 144 lb (65.3 kg)  Height: 5\' 4"  (1.626 m)   Body mass index is 24.72 kg/m.   General appearance:  Normal Thyroid:  Symmetrical, normal in size, without palpable masses or nodularity. Respiratory  Auscultation:  Clear without wheezing or rhonchi Cardiovascular  Auscultation:  Regular rate, without rubs, murmurs or gallops  Edema/varicosities:  Not grossly evident Abdominal  Soft,nontender, without masses, guarding or rebound.  Liver/spleen:  No organomegaly noted  Hernia:  None appreciated  Skin  Inspection:  Grossly normal   Breasts: Examined lying and sitting.     Right: Without masses, retractions, discharge or axillary adenopathy.     Left: Without masses, retractions, discharge or axillary adenopathy. Gentitourinary   Inguinal/mons:  Normal without inguinal adenopathy  External genitalia:  Normal  BUS/Urethra/Skene's glands:  Normal  Vagina:  Normal  Cervix:  Normal  Uterus:  normal in size, shape and contour.  Midline and mobile  Adnexa/parametria:     Rt: Without masses or tenderness.   Lt: Without masses or tenderness.  Anus and perineum: Normal  Digital rectal exam: Normal sphincter tone without palpated masses  or tenderness  Assessment/Plan:  40 y.o. MWF G1 P1 for annual exam no complaints.  2002 CIN-3 with LEEP, 2010 CIN-1 cryo-normal after. Anxiety and depression stable on Zoloft Acne stable on Aldactone Contraception management  Plan: Contraception options reviewed and declined will continue condoms. SBE's, annual screening mammogram at 40, breast center information given and reviewed. Exercise, calcium rich diet, MVI daily encouraged. Zoloft 75 mg by mouth daily prescription, proper use given and reviewed, counseling as needed. Aldactone 25 mg by mouth daily prescription, proper use given and reviewed. CBC, TSH, UA, Pap normal with negative HR HPV typing, will repeat next year.  Harrington ChallengerYOUNG,Beulah Matusek J [facility] LLCWHNP, 10:51 AM 07/12/2016

## 2016-10-22 ENCOUNTER — Telehealth: Payer: Self-pay | Admitting: *Deleted

## 2016-10-22 MED ORDER — LEVONORGESTREL-ETHINYL ESTRAD 0.1-20 MG-MCG PO TABS: 1.0000 | ORAL_TABLET | Freq: Every day | ORAL | 11 refills | Status: DC

## 2016-10-22 NOTE — Telephone Encounter (Signed)
Pt informed with the below note, Rx sent. 

## 2016-10-22 NOTE — Telephone Encounter (Signed)
Pt declined birth control pills on office visit 07/28/16, but now would like Rx for low dose pill. Please advise

## 2016-10-22 NOTE — Telephone Encounter (Signed)
Molli Knockkay, Alesse prescription 1 daily start Sunday after cycle starts or if cycle starts on a Monday or Tuesday can start first day. (Reviewed purpose of Sunday started so they don't have a cycle on the weekends.) Have her check her blood pressure after being on them for 2 weeks to make sure not elevating. Should be less than 130/80. First month not noncontraceptive, condoms.

## 2016-10-22 NOTE — Telephone Encounter (Signed)
Left message for pt to call.

## 2016-12-19 ENCOUNTER — Other Ambulatory Visit: Payer: Self-pay | Admitting: Women's Health

## 2016-12-19 DIAGNOSIS — F32A Depression, unspecified: Secondary | ICD-10-CM

## 2016-12-19 DIAGNOSIS — F329 Major depressive disorder, single episode, unspecified: Secondary | ICD-10-CM

## 2017-03-26 ENCOUNTER — Encounter: Payer: Self-pay | Admitting: Gynecology

## 2017-04-04 ENCOUNTER — Ambulatory Visit (INDEPENDENT_AMBULATORY_CARE_PROVIDER_SITE_OTHER): Payer: BLUE CROSS/BLUE SHIELD | Admitting: Physician Assistant

## 2017-04-04 ENCOUNTER — Encounter: Payer: Self-pay | Admitting: Physician Assistant

## 2017-04-04 VITALS — BP 124/77 | HR 67 | Temp 98.9°F | Resp 17 | Ht 64.0 in | Wt 147.0 lb

## 2017-04-04 DIAGNOSIS — Z130 Encounter for screening for diseases of the blood and blood-forming organs and certain disorders involving the immune mechanism: Secondary | ICD-10-CM

## 2017-04-04 DIAGNOSIS — Z1329 Encounter for screening for other suspected endocrine disorder: Secondary | ICD-10-CM | POA: Diagnosis not present

## 2017-04-04 DIAGNOSIS — Z13228 Encounter for screening for other metabolic disorders: Secondary | ICD-10-CM

## 2017-04-04 DIAGNOSIS — Z0000 Encounter for general adult medical examination without abnormal findings: Secondary | ICD-10-CM | POA: Diagnosis not present

## 2017-04-04 DIAGNOSIS — Z13220 Encounter for screening for lipoid disorders: Secondary | ICD-10-CM

## 2017-04-04 NOTE — Progress Notes (Signed)
Hannah Flores  MRN: [id number] DOB: 1976-02-15  PCP: No primary care provider on file.  Subjective:  Pt presents to clinic for a CPE.  She is doing well and has no concerns.  Last dental exam: every 6 months Last vision exam: >10 years Last pap: 9/17 Last mammo: not yet scheduled  Vaccinations - UTD    Typical meals for patient: 3 meals a day, no snacks Typical beverage choices: water, diet soda in the am, tea with lunch Exercises: 4 times per week for 1 hour Sleeps: sleeping well 7 hrs per night  Patient Active Problem List   Diagnosis Date Noted  . Cervical dysplasia 06/17/2013  . Depression 05/08/2012    Review of Systems  Constitutional: Negative.   HENT: Negative.   Eyes: Negative.   Respiratory: Negative.   Cardiovascular: Negative.   Gastrointestinal: Negative.   Endocrine: Negative.   Genitourinary: Negative.   Musculoskeletal: Negative.   Skin: Negative.   Allergic/Immunologic: Negative.   Neurological: Negative.   Hematological: Negative.   Psychiatric/Behavioral: Negative.      Current Outpatient Prescriptions on File Prior to Visit  Medication Sig Dispense Refill  . levonorgestrel-ethinyl estradiol (AVIANE,ALESSE,LESSINA) 0.1-20 MG-MCG tablet Take 1 tablet by mouth daily. 1 Package 11  . sertraline (ZOLOFT) 50 MG tablet TAKE 1 AND 1/2 TABLET BY MOUTH EVERY DAY 135 tablet 2  . spironolactone (ALDACTONE) 25 MG tablet Take 1 tablet (25 mg total) by mouth daily. 90 tablet 4   No current facility-administered medications on file prior to visit.     No Known Allergies  Social History   Social History  . Marital status: Married    Spouse name: N/A  . Number of children: N/A  . Years of education: N/A   Social History Main Topics  . Smoking status: Never Smoker  . Smokeless tobacco: Never Used  . Alcohol use 0.0 oz/week     Comment: 2 drinks a month  . Drug use: No  . Sexual activity: Yes    Birth control/ protection: , Pill     Comment:  intercourse age 39, sexual partners more than 5   Other Topics Concern  . 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 CESAREAN SECTION  11/29/2012   Procedure: CESAREAN SECTION;  Surgeon: Lovenia Kim, MD;  Location: [facility];  Service: Obstetrics;  Laterality: N/A;  Primary cesarean section with delivery of baby boy at 45.   Apgars 8/8.  Marland Kitchen GYNECOLOGIC CRYOSURGERY  06/27/09  . TYMPANOSTOMY TUBE PLACEMENT      Family History  Problem Relation Age of Onset  . Cancer Other        ovarian  . Hypertension Father   . Cancer Paternal Grandmother        LUNG CANCER--SMOKER  . Stroke Paternal Grandfather      Objective:  BP 124/77   Pulse 67   Temp 98.9 F (37.2 C) (Oral)   Resp 17   Ht '5\' 4"'$  (1.626 m)   Wt 147 lb (66.7 kg)   LMP 04/02/2017 (Approximate)   SpO2 98%   BMI 25.23 kg/m   Physical Exam  Constitutional: She is oriented to person, place, and time and well-developed, well-nourished, and in no distress.  HENT:  Head: Normocephalic and atraumatic.  Right Ear: Hearing, tympanic membrane, external ear and ear canal normal.  Left Ear: Hearing, tympanic membrane, external ear and ear canal normal.  Nose: Nose normal.  Mouth/Throat: Uvula is midline, oropharynx is clear and moist and mucous membranes are normal.  Eyes: Conjunctivae and EOM are normal. Pupils are equal, round, and reactive to light.  Neck: Trachea normal and normal range of motion. Neck supple. No thyroid mass and no thyromegaly present.  Cardiovascular: Normal rate, regular rhythm and normal heart sounds.   No murmur heard. Pulmonary/Chest: Effort normal and breath sounds normal. She has no wheezes.  Abdominal: Soft. Bowel sounds are normal. There is no tenderness.  Musculoskeletal: Normal range of motion.  Lymphadenopathy:    She has no  cervical adenopathy.  Neurological: She is alert and oriented to person, place, and time. She has normal motor skills, normal sensation, normal strength and normal reflexes. Gait normal.  Skin: Skin is warm and dry.  Psychiatric: Mood, memory, affect and judgment normal.    Wt Readings from Last 3 Encounters:  04/04/17 147 lb (66.7 kg)  07/12/16 144 lb (65.3 kg)  07/12/15 139 lb (63 kg)     Visual Acuity Screening   Right eye Left eye Both eyes  Without correction: 20/15 20/15 20/15  With correction:       Assessment and Plan :  Annual physical exam  Screening for thyroid disorder - Plan: TSH  Screening for metabolic disorder - Plan: CMP14+EGFR  Screening for deficiency anemia - Plan: CBC with Differential/Platelet  Screening cholesterol level - Plan: Lipid panel   Anticipatory guidance.  Windell Hummingbird PA-C  [facility] 04/04/2017 8:34 AM

## 2017-04-04 NOTE — Patient Instructions (Addendum)
Health Maintenance, Female Adopting a healthy lifestyle and getting preventive care can go a long way to promote health and wellness. Talk with your health care provider about what schedule of regular examinations is right for you. This is a good chance for you to check in with your provider about disease prevention and staying healthy. In between checkups, there are plenty of things you can do on your own. Experts have done a lot of research about which lifestyle changes and preventive measures are most likely to keep you healthy. Ask your health care provider for more information. Weight and diet Eat a healthy diet  Be sure to include plenty of vegetables, fruits, low-fat dairy products, and lean protein.  Do not eat a lot of foods high in solid fats, added sugars, or salt.  Get regular exercise. This is one of the most important things you can do for your health.  Most adults should exercise for at least 150 minutes each week. The exercise should increase your heart rate and make you sweat (moderate-intensity exercise).  Most adults should also do strengthening exercises at least twice a week. This is in addition to the moderate-intensity exercise. Maintain a healthy weight  Body mass index (BMI) is a measurement that can be used to identify possible weight problems. It estimates body fat based on height and weight. Your health care provider can help determine your BMI and help you achieve or maintain a healthy weight.  For females 76 years of age and older:  A BMI below 18.5 is considered underweight.  A BMI of 18.5 to 24.9 is normal.  A BMI of 25 to 29.9 is considered overweight.  A BMI of 30 and above is considered obese. Watch levels of cholesterol and blood lipids  You should start having your blood tested for lipids and cholesterol at 41 years of age, then have this test every 5 years.  You may need to have your cholesterol levels checked more often if:  Your lipid or  cholesterol levels are high.  You are older than 41 years of age.  You are at high risk for heart disease. Cancer screening Lung Cancer  Lung cancer screening is recommended for adults 64-42 years old who are at high risk for lung cancer because of a history of smoking.  A yearly low-dose CT scan of the lungs is recommended for people who:  Currently smoke.  Have quit within the past 15 years.  Have at least a 30-pack-year history of smoking. A pack year is smoking an average of one pack of cigarettes a day for 1 year.  Yearly screening should continue until it has been 15 years since you quit.  Yearly screening should stop if you develop a health problem that would prevent you from having lung cancer treatment. Breast Cancer  Practice breast self-awareness. This means understanding how your breasts normally appear and feel.  It also means doing regular breast self-exams. Let your health care provider know about any changes, no matter how small.  If you are in your 20s or 30s, you should have a clinical breast exam (CBE) by a health care provider every 1-3 years as part of a regular health exam.  If you are 34 or older, have a CBE every year. Also consider having a breast X-ray (mammogram) every year.  If you have a family history of breast cancer, talk to your health care provider about genetic screening.  If you are at high risk for breast cancer, talk  to your health care provider about having an MRI and a mammogram every year.  Breast cancer gene (BRCA) assessment is recommended for women who have family members with BRCA-related cancers. BRCA-related cancers include:  Breast.  Ovarian.  Tubal.  Peritoneal cancers.  Results of the assessment will determine the need for genetic counseling and BRCA1 and BRCA2 testing. Cervical Cancer  Your health care provider may recommend that you be screened regularly for cancer of the pelvic organs (ovaries, uterus, and vagina).  This screening involves a pelvic examination, including checking for microscopic changes to the surface of your cervix (Pap test). You may be encouraged to have this screening done every 3 years, beginning at age 24.  For women ages 66-65, health care providers may recommend pelvic exams and Pap testing every 3 years, or they may recommend the Pap and pelvic exam, combined with testing for human papilloma virus (HPV), every 5 years. Some types of HPV increase your risk of cervical cancer. Testing for HPV may also be done on women of any age with unclear Pap test results.  Other health care providers may not recommend any screening for nonpregnant women who are considered low risk for pelvic cancer and who do not have symptoms. Ask your health care provider if a screening pelvic exam is right for you.  If you have had past treatment for cervical cancer or a condition that could lead to cancer, you need Pap tests and screening for cancer for at least 20 years after your treatment. If Pap tests have been discontinued, your risk factors (such as having a new sexual partner) need to be reassessed to determine if screening should resume. Some women have medical problems that increase the chance of getting cervical cancer. In these cases, your health care provider may recommend more frequent screening and Pap tests. Colorectal Cancer  This type of cancer can be detected and often prevented.  Routine colorectal cancer screening usually begins at 41 years of age and continues through 41 years of age.  Your health care provider may recommend screening at an earlier age if you have risk factors for colon cancer.  Your health care provider may also recommend using home test kits to check for hidden blood in the stool.  A small camera at the end of a tube can be used to examine your colon directly (sigmoidoscopy or colonoscopy). This is done to check for the earliest forms of colorectal cancer.  Routine  screening usually begins at age 41.  Direct examination of the colon should be repeated every 5-10 years through 41 years of age. However, you may need to be screened more often if early forms of precancerous polyps or small growths are found. Skin Cancer  Check your skin from head to toe regularly.  Tell your health care provider about any new moles or changes in moles, especially if there is a change in a mole's shape or color.  Also tell your health care provider if you have a mole that is larger than the size of a pencil eraser.  Always use sunscreen. Apply sunscreen liberally and repeatedly throughout the day.  Protect yourself by wearing long sleeves, pants, a wide-brimmed hat, and sunglasses whenever you are outside. Heart disease, diabetes, and high blood pressure  High blood pressure causes heart disease and increases the risk of stroke. High blood pressure is more likely to develop in:  People who have blood pressure in the high end of the normal range (130-139/85-89 mm Hg).  People who are overweight or obese.  People who are African American.  If you are 59-24 years of age, have your blood pressure checked every 3-5 years. If you are 34 years of age or older, have your blood pressure checked every year. You should have your blood pressure measured twice-once when you are at a hospital or clinic, and once when you are not at a hospital or clinic. Record the average of the two measurements. To check your blood pressure when you are not at a hospital or clinic, you can use:  An automated blood pressure machine at a pharmacy.  A home blood pressure monitor.  If you are between 29 years and 60 years old, ask your health care provider if you should take aspirin to prevent strokes.  Have regular diabetes screenings. This involves taking a blood sample to check your fasting blood sugar level.  If you are at a normal weight and have a low risk for diabetes, have this test once  every three years after 41 years of age.  If you are overweight and have a high risk for diabetes, consider being tested at a younger age or more often. Preventing infection Hepatitis B  If you have a higher risk for hepatitis B, you should be screened for this virus. You are considered at high risk for hepatitis B if:  You were born in a country where hepatitis B is common. Ask your health care provider which countries are considered high risk.  Your parents were born in a high-risk country, and you have not been immunized against hepatitis B (hepatitis B vaccine).  You have HIV or AIDS.  You use needles to inject street drugs.  You live with someone who has hepatitis B.  You have had sex with someone who has hepatitis B.  You get hemodialysis treatment.  You take certain medicines for conditions, including cancer, organ transplantation, and autoimmune conditions. Hepatitis C  Blood testing is recommended for:  Everyone born from [birth year] through 1965.  Anyone with known risk factors for hepatitis C. Sexually transmitted infections (STIs)  You should be screened for sexually transmitted infections (STIs) including gonorrhea and chlamydia if:  You are sexually active and are younger than 41 years of age.  You are older than 41 years of age and your health care provider tells you that you are at risk for this type of infection.  Your sexual activity has changed since you were last screened and you are at an increased risk for chlamydia or gonorrhea. Ask your health care provider if you are at risk.  If you do not have HIV, but are at risk, it may be recommended that you take a prescription medicine daily to prevent HIV infection. This is called pre-exposure prophylaxis (PrEP). You are considered at risk if:  You are sexually active and do not regularly use condoms or know the HIV status of your partner(s).  You take drugs by injection.  You are sexually active with a partner  who has HIV. Talk with your health care provider about whether you are at high risk of being infected with HIV. If you choose to begin PrEP, you should first be tested for HIV. You should then be tested every 3 months for as long as you are taking PrEP. Pregnancy  If you are premenopausal and you may become pregnant, ask your health care provider about preconception counseling.  If you may become pregnant, take 400 to 800 micrograms (mcg) of folic acid  every day.  If you want to prevent pregnancy, talk to your health care provider about birth control (contraception). Osteoporosis and menopause  Osteoporosis is a disease in which the bones lose minerals and strength with aging. This can result in serious bone fractures. Your risk for osteoporosis can be identified using a bone density scan.  If you are 65 years of age or older, or if you are at risk for osteoporosis and fractures, ask your health care provider if you should be screened.  Ask your health care provider whether you should take a calcium or vitamin D supplement to lower your risk for osteoporosis.  Menopause may have certain physical symptoms and risks.  Hormone replacement therapy may reduce some of these symptoms and risks. Talk to your health care provider about whether hormone replacement therapy is right for you. Follow these instructions at home:  Schedule regular health, dental, and eye exams.  Stay current with your immunizations.  Do not use any tobacco products including cigarettes, chewing tobacco, or electronic cigarettes.  If you are pregnant, do not drink alcohol.  If you are breastfeeding, limit how much and how often you drink alcohol.  Limit alcohol intake to no more than 1 drink per day for nonpregnant women. One drink equals 12 ounces of beer, 5 ounces of wine, or 1 ounces of hard liquor.  Do not use street drugs.  Do not share needles.  Ask your health care provider for help if you need support  or information about quitting drugs.  Tell your health care provider if you often feel depressed.  Tell your health care provider if you have ever been abused or do not feel safe at home. This information is not intended to replace advice given to you by your health care provider. Make sure you discuss any questions you have with your health care provider. Document Released: 05/13/2011 Document Revised: 04/04/2016 Document Reviewed: 08/01/2015 [employer] Interactive Patient Education  2017 [employer]    IF you received an x-ray today, you will receive an invoice from [facility]. Please contact [facility] at [phone] with questions or concerns regarding your invoice.   IF you received labwork today, you will receive an invoice from [facility]. Please contact [facility] at [phone] with questions or concerns regarding your invoice.   Our billing staff will not be able to assist you with questions regarding bills from these companies.  You will be contacted with the lab results as soon as they are available. The fastest way to get your results is to activate your My Chart account. Instructions are located on the last page of this paperwork. If you have not heard from us regarding the results in 2 weeks, please contact this office.      

## 2017-04-05 LAB — LIPID PANEL
Chol/HDL Ratio: 2.4 ratio (ref 0.0–4.4)
Cholesterol, Total: 193 mg/dL (ref 100–199)
HDL: 79 mg/dL (ref >39)
LDL Calculated: 92 mg/dL (ref 0–99)
TRIGLYCERIDES: 109 mg/dL (ref 0–149)
VLDL Cholesterol Cal: 22 mg/dL (ref 5–40)

## 2017-04-05 LAB — CMP14+EGFR
ALK PHOS: 55 IU/L (ref 39–117)
ALT: 20 IU/L (ref 0–32)
AST: 17 IU/L (ref 0–40)
Albumin/Globulin Ratio: 1.8 (ref 1.2–2.2)
Albumin: 4.3 g/dL (ref 3.5–5.5)
BUN/Creatinine Ratio: 11 (ref 9–23)
BUN: 10 mg/dL (ref 6–24)
Bilirubin Total: 0.2 mg/dL (ref 0.0–1.2)
CO2: 21 mmol/L (ref 18–29)
Calcium: 9.2 mg/dL (ref 8.7–10.2)
Chloride: 102 mmol/L (ref 96–106)
Creatinine, Ser: 0.9 mg/dL (ref 0.57–1.00)
GFR calc Af Amer: 92 mL/min/{1.73_m2} (ref >59)
GFR calc non Af Amer: 80 mL/min/{1.73_m2} (ref >59)
GLOBULIN, TOTAL: 2.4 g/dL (ref 1.5–4.5)
Glucose: 97 mg/dL (ref 65–99)
POTASSIUM: 4.5 mmol/L (ref 3.5–5.2)
SODIUM: 141 mmol/L (ref 134–144)
Total Protein: 6.7 g/dL (ref 6.0–8.5)

## 2017-04-05 LAB — CBC WITH DIFFERENTIAL/PLATELET
BASOS ABS: 0.1 10*3/uL (ref 0.0–0.2)
Basos: 1 %
EOS (ABSOLUTE): 0.1 10*3/uL (ref 0.0–0.4)
Eos: 3 %
Hematocrit: 34.4 % (ref 34.0–46.6)
Hemoglobin: 11.4 g/dL (ref 11.1–15.9)
IMMATURE GRANULOCYTES: 0 %
Immature Grans (Abs): 0 10*3/uL (ref 0.0–0.1)
Lymphocytes Absolute: 1.7 10*3/uL (ref 0.7–3.1)
Lymphs: 39 %
MCH: 25.7 pg — ABNORMAL LOW (ref 26.6–33.0)
MCHC: 33.1 g/dL (ref 31.5–35.7)
MCV: 78 fL — AB (ref 79–97)
MONOS ABS: 0.2 10*3/uL (ref 0.1–0.9)
Monocytes: 5 %
NEUTROS PCT: 52 %
Neutrophils Absolute: 2.2 10*3/uL (ref 1.4–7.0)
PLATELETS: 278 10*3/uL (ref 150–379)
RBC: 4.43 x10E6/uL (ref 3.77–5.28)
RDW: 15.3 % (ref 12.3–15.4)
WBC: 4.3 10*3/uL (ref 3.4–10.8)

## 2017-04-05 LAB — TSH: TSH: 1.96 u[IU]/mL (ref 0.450–4.500)

## 2017-07-16 ENCOUNTER — Encounter: Payer: Self-pay | Admitting: Women's Health

## 2017-07-16 ENCOUNTER — Ambulatory Visit (INDEPENDENT_AMBULATORY_CARE_PROVIDER_SITE_OTHER): Payer: BLUE CROSS/BLUE SHIELD | Admitting: Women's Health

## 2017-07-16 VITALS — BP 132/80 | Ht 64.0 in | Wt 142.0 lb

## 2017-07-16 DIAGNOSIS — Z13220 Encounter for screening for lipoid disorders: Secondary | ICD-10-CM

## 2017-07-16 DIAGNOSIS — Z01419 Encounter for gynecological examination (general) (routine) without abnormal findings: Secondary | ICD-10-CM

## 2017-07-16 DIAGNOSIS — Z3041 Encounter for surveillance of contraceptive pills: Secondary | ICD-10-CM

## 2017-07-16 DIAGNOSIS — E282 Polycystic ovarian syndrome: Secondary | ICD-10-CM | POA: Diagnosis not present

## 2017-07-16 DIAGNOSIS — F323 Major depressive disorder, single episode, severe with psychotic features: Secondary | ICD-10-CM | POA: Diagnosis not present

## 2017-07-16 DIAGNOSIS — R87618 Other abnormal cytological findings on specimens from cervix uteri: Secondary | ICD-10-CM | POA: Diagnosis not present

## 2017-07-16 LAB — LIPID PANEL
CHOLESTEROL: 192 mg/dL (ref <200)
HDL: 87 mg/dL (ref >50)
LDL Cholesterol (Calc): 84 mg/dL (calc)
NON-HDL CHOLESTEROL (CALC): 105 mg/dL (ref <130)
Total CHOL/HDL Ratio: 2.2 (calc) (ref <5.0)
Triglycerides: 109 mg/dL (ref <150)

## 2017-07-16 LAB — CBC WITH DIFFERENTIAL/PLATELET
BASOS ABS: 53 {cells}/uL (ref 0–200)
Basophils Relative: 0.8 %
EOS ABS: 73 {cells}/uL (ref 15–500)
EOS PCT: 1.1 %
HCT: 33.7 % — ABNORMAL LOW (ref 35.0–45.0)
Hemoglobin: 10.7 g/dL — ABNORMAL LOW (ref 11.7–15.5)
Lymphs Abs: 1808 cells/uL (ref 850–3900)
MCH: 24.6 pg — ABNORMAL LOW (ref 27.0–33.0)
MCHC: 31.8 g/dL — AB (ref 32.0–36.0)
MCV: 77.5 fL — ABNORMAL LOW (ref 80.0–100.0)
MONOS PCT: 8.5 %
MPV: 12.1 fL (ref 7.5–12.5)
Neutro Abs: 4105 cells/uL (ref 1500–7800)
Neutrophils Relative %: 62.2 %
Platelets: 262 10*3/uL (ref 140–400)
RBC: 4.35 10*6/uL (ref 3.80–5.10)
RDW: 14.2 % (ref 11.0–15.0)
Total Lymphocyte: 27.4 %
WBC: 6.6 10*3/uL (ref 3.8–10.8)
WBCMIX: 561 {cells}/uL (ref 200–950)

## 2017-07-16 LAB — GLUCOSE, RANDOM: Glucose, Bld: 83 mg/dL (ref 65–99)

## 2017-07-16 MED ORDER — SPIRONOLACTONE 25 MG PO TABS: 25.0000 mg | ORAL_TABLET | Freq: Every day | ORAL | 4 refills | Status: DC

## 2017-07-16 MED ORDER — SERTRALINE HCL 50 MG PO TABS: 75.0000 mg | ORAL_TABLET | Freq: Every day | ORAL | 4 refills | Status: DC

## 2017-07-16 MED ORDER — LEVONORGESTREL-ETHINYL ESTRAD 0.1-20 MG-MCG PO TABS: 1.0000 | ORAL_TABLET | Freq: Every day | ORAL | 4 refills | Status: DC

## 2017-07-16 NOTE — Patient Instructions (Signed)
Breast center  [phone]  Health Maintenance, Female Adopting a healthy lifestyle and getting preventive care can go a long way to promote health and wellness. Talk with your health care provider about what schedule of regular examinations is right for you. This is a good chance for you to check in with your provider about disease prevention and staying healthy. In between checkups, there are plenty of things you can do on your own. Experts have done a lot of research about which lifestyle changes and preventive measures are most likely to keep you healthy. Ask your health care provider for more information. Weight and diet Eat a healthy diet  Be sure to include plenty of vegetables, fruits, low-fat dairy products, and lean protein.  Do not eat a lot of foods high in solid fats, added sugars, or salt.  Get regular exercise. This is one of the most important things you can do for your health. ? Most adults should exercise for at least 150 minutes each week. The exercise should increase your heart rate and make you sweat (moderate-intensity exercise). ? Most adults should also do strengthening exercises at least twice a week. This is in addition to the moderate-intensity exercise.  Maintain a healthy weight  Body mass index (BMI) is a measurement that can be used to identify possible weight problems. It estimates body fat based on height and weight. Your health care provider can help determine your BMI and help you achieve or maintain a healthy weight.  For females 20 years of age and older: ? A BMI below 18.5 is considered underweight. ? A BMI of 18.5 to 24.9 is normal. ? A BMI of 25 to 29.9 is considered overweight. ? A BMI of 30 and above is considered obese.  Watch levels of cholesterol and blood lipids  You should start having your blood tested for lipids and cholesterol at 41 years of age, then have this test every 5 years.  You may need to have your cholesterol levels checked more  often if: ? Your lipid or cholesterol levels are high. ? You are older than 41 years of age. ? You are at high risk for heart disease.  Cancer screening Lung Cancer  Lung cancer screening is recommended for adults 55-80 years old who are at high risk for lung cancer because of a history of smoking.  A yearly low-dose CT scan of the lungs is recommended for people who: ? Currently smoke. ? Have quit within the past 15 years. ? Have at least a 30-pack-year history of smoking. A pack year is smoking an average of one pack of cigarettes a day for 1 year.  Yearly screening should continue until it has been 15 years since you quit.  Yearly screening should stop if you develop a health problem that would prevent you from having lung cancer treatment.  Breast Cancer  Practice breast self-awareness. This means understanding how your breasts normally appear and feel.  It also means doing regular breast self-exams. Let your health care provider know about any changes, no matter how small.  If you are in your 20s or 30s, you should have a clinical breast exam (CBE) by a health care provider every 1-3 years as part of a regular health exam.  If you are 40 or older, have a CBE every year. Also consider having a breast X-ray (mammogram) every year.  If you have a family history of breast cancer, talk to your health care provider about genetic screening.  If   you are at high risk for breast cancer, talk to your health care provider about having an MRI and a mammogram every year.  Breast cancer gene (BRCA) assessment is recommended for women who have family members with BRCA-related cancers. BRCA-related cancers include: ? Breast. ? Ovarian. ? Tubal. ? Peritoneal cancers.  Results of the assessment will determine the need for genetic counseling and BRCA1 and BRCA2 testing.  Cervical Cancer Your health care provider may recommend that you be screened regularly for cancer of the pelvic organs  (ovaries, uterus, and vagina). This screening involves a pelvic examination, including checking for microscopic changes to the surface of your cervix (Pap test). You may be encouraged to have this screening done every 3 years, beginning at age 21.  For women ages 30-65, health care providers may recommend pelvic exams and Pap testing every 3 years, or they may recommend the Pap and pelvic exam, combined with testing for human papilloma virus (HPV), every 5 years. Some types of HPV increase your risk of cervical cancer. Testing for HPV may also be done on women of any age with unclear Pap test results.  Other health care providers may not recommend any screening for nonpregnant women who are considered low risk for pelvic cancer and who do not have symptoms. Ask your health care provider if a screening pelvic exam is right for you.  If you have had past treatment for cervical cancer or a condition that could lead to cancer, you need Pap tests and screening for cancer for at least 20 years after your treatment. If Pap tests have been discontinued, your risk factors (such as having a new sexual partner) need to be reassessed to determine if screening should resume. Some women have medical problems that increase the chance of getting cervical cancer. In these cases, your health care provider may recommend more frequent screening and Pap tests.  Colorectal Cancer  This type of cancer can be detected and often prevented.  Routine colorectal cancer screening usually begins at 41 years of age and continues through 41 years of age.  Your health care provider may recommend screening at an earlier age if you have risk factors for colon cancer.  Your health care provider may also recommend using home test kits to check for hidden blood in the stool.  A small camera at the end of a tube can be used to examine your colon directly (sigmoidoscopy or colonoscopy). This is done to check for the earliest forms of  colorectal cancer.  Routine screening usually begins at age 50.  Direct examination of the colon should be repeated every 5-10 years through 41 years of age. However, you may need to be screened more often if early forms of precancerous polyps or small growths are found.  Skin Cancer  Check your skin from head to toe regularly.  Tell your health care provider about any new moles or changes in moles, especially if there is a change in a mole's shape or color.  Also tell your health care provider if you have a mole that is larger than the size of a pencil eraser.  Always use sunscreen. Apply sunscreen liberally and repeatedly throughout the day.  Protect yourself by wearing long sleeves, pants, a wide-brimmed hat, and sunglasses whenever you are outside.  Heart disease, diabetes, and high blood pressure  High blood pressure causes heart disease and increases the risk of stroke. High blood pressure is more likely to develop in: ? People who have blood pressure   in the high end of the normal range (130-139/85-89 mm Hg). ? People who are overweight or obese. ? People who are African American.  If you are 28-40 years of age, have your blood pressure checked every 3-5 years. If you are 66 years of age or older, have your blood pressure checked every year. You should have your blood pressure measured twice-once when you are at a hospital or clinic, and once when you are not at a hospital or clinic. Record the average of the two measurements. To check your blood pressure when you are not at a hospital or clinic, you can use: ? An automated blood pressure machine at a pharmacy. ? A home blood pressure monitor.  If you are between 22 years and 24 years old, ask your health care provider if you should take aspirin to prevent strokes.  Have regular diabetes screenings. This involves taking a blood sample to check your fasting blood sugar level. ? If you are at a normal weight and have a low risk  for diabetes, have this test once every three years after 41 years of age. ? If you are overweight and have a high risk for diabetes, consider being tested at a younger age or more often. Preventing infection Hepatitis B  If you have a higher risk for hepatitis B, you should be screened for this virus. You are considered at high risk for hepatitis B if: ? You were born in a country where hepatitis B is common. Ask your health care provider which countries are considered high risk. ? Your parents were born in a high-risk country, and you have not been immunized against hepatitis B (hepatitis B vaccine). ? You have HIV or AIDS. ? You use needles to inject street drugs. ? You live with someone who has hepatitis B. ? You have had sex with someone who has hepatitis B. ? You get hemodialysis treatment. ? You take certain medicines for conditions, including cancer, organ transplantation, and autoimmune conditions.  Hepatitis C  Blood testing is recommended for: ? Everyone born from [birth year] through 1965. ? Anyone with known risk factors for hepatitis C.  Sexually transmitted infections (STIs)  You should be screened for sexually transmitted infections (STIs) including gonorrhea and chlamydia if: ? You are sexually active and are younger than 41 years of age. ? You are older than 41 years of age and your health care provider tells you that you are at risk for this type of infection. ? Your sexual activity has changed since you were last screened and you are at an increased risk for chlamydia or gonorrhea. Ask your health care provider if you are at risk.  If you do not have HIV, but are at risk, it may be recommended that you take a prescription medicine daily to prevent HIV infection. This is called pre-exposure prophylaxis (PrEP). You are considered at risk if: ? You are sexually active and do not regularly use condoms or know the HIV status of your partner(s). ? You take drugs by  injection. ? You are sexually active with a partner who has HIV.  Talk with your health care provider about whether you are at high risk of being infected with HIV. If you choose to begin PrEP, you should first be tested for HIV. You should then be tested every 3 months for as long as you are taking PrEP. Pregnancy  If you are premenopausal and you may become pregnant, ask your health care provider about preconception counseling.  If you may become pregnant, take 400 to 800 micrograms (mcg) of folic acid every day.  If you want to prevent pregnancy, talk to your health care provider about birth control (contraception). Osteoporosis and menopause  Osteoporosis is a disease in which the bones lose minerals and strength with aging. This can result in serious bone fractures. Your risk for osteoporosis can be identified using a bone density scan.  If you are 17 years of age or older, or if you are at risk for osteoporosis and fractures, ask your health care provider if you should be screened.  Ask your health care provider whether you should take a calcium or vitamin D supplement to lower your risk for osteoporosis.  Menopause may have certain physical symptoms and risks.  Hormone replacement therapy may reduce some of these symptoms and risks. Talk to your health care provider about whether hormone replacement therapy is right for you. Follow these instructions at home:  Schedule regular health, dental, and eye exams.  Stay current with your immunizations.  Do not use any tobacco products including cigarettes, chewing tobacco, or electronic cigarettes.  If you are pregnant, do not drink alcohol.  If you are breastfeeding, limit how much and how often you drink alcohol.  Limit alcohol intake to no more than 1 drink per day for nonpregnant women. One drink equals 12 ounces of beer, 5 ounces of wine, or 1 ounces of hard liquor.  Do not use street drugs.  Do not share needles.  Ask  your health care provider for help if you need support or information about quitting drugs.  Tell your health care provider if you often feel depressed.  Tell your health care provider if you have ever been abused or do not feel safe at home. This information is not intended to replace advice given to you by your health care provider. Make sure you discuss any questions you have with your health care provider. Document Released: 05/13/2011 Document Revised: 04/04/2016 Document Reviewed: 08/01/2015 Elsevier Interactive Patient Education  [employer].

## 2017-07-16 NOTE — Progress Notes (Signed)
Hannah Flores Aug 16, 1976 [id number]    History:    Presents for annual exam.  Mostly monthly cycle on Alesse, has had several missed pills and spotting this past month. Same partner. 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 History of anxiety and depression stable on Zoloft. History of PCOS on Aldactone. Has not had a screening mammogram.  Past medical history, past surgical history, family history and social history were all reviewed and documented in the EPIC chart. Works for a [employer]. Son 4-1/2 doing well. Husband history of opioid addiction 3 years clean doing well.  ROS:  A ROS was performed and pertinent positives and negatives are included.  Exam:  Vitals:   07/16/17 0826  BP: 132/80  Weight: 142 lb (64.4 kg)  Height: 5\' 4"  (1.626 m)   Body mass index is 24.37 kg/m.   General appearance:  Normal Thyroid:  Symmetrical, normal in size, without palpable masses or nodularity. Respiratory  Auscultation:  Clear without wheezing or rhonchi Cardiovascular  Auscultation:  Regular rate, without rubs, murmurs or gallops  Edema/varicosities:  Not grossly evident Abdominal  Soft,nontender, without masses, guarding or rebound.  Liver/spleen:  No organomegaly noted  Hernia:  None appreciated  Skin  Inspection:  Grossly normal   Breasts: Examined lying and sitting.     Right: Without masses, retractions, discharge or axillary adenopathy.     Left: Without masses, retractions, discharge or axillary adenopathy. Gentitourinary   Inguinal/mons:  Normal without inguinal adenopathy  External genitalia:  Normal  BUS/Urethra/Skene's glands:  Normal  Vagina:  Normal  Cervix:  Normal  Uterus:   normal in size, shape and contour.  Midline and mobile  Adnexa/parametria:     Rt: Without masses or tenderness.   Lt: Without masses or tenderness.  Anus and perineum: Normal  Digital rectal exam: Normal sphincter tone without palpated  masses or tenderness  Assessment/Plan:  41 y.o. MWF G1 P1 for annual exam with no complaints.  Monthly cycle on Alesse Anxiety depression stable on Zoloft PCOS on Aldactone 2002 CIN-3 with LEEP  Plan: Alesse prescription, proper use, slight risk for blood clots and strokes, reviewed importance of taking daily. Instructed to call if continued spotting. SBE's, annual screening mammogram, breast center information given instructed to schedule. Continue regular exercise, running, calcium rich diet, vitamin D 1000 daily encouraged. Zoloft 50 mg by mouth daily, denies need for counseling at this time. Aldactone 25 mg daily, prescription, proper use given and reviewed. Leisure activities encouraged. CBC, glucose, lipid panel, Pap with HR HPV typing.    Harrington Challengerancy J Calyn Rubi [facility], 8:51 AM 07/16/2017

## 2017-07-16 NOTE — Addendum Note (Signed)
Addended by: Kem ParkinsonBARNES, Kenni Newton on: 07/16/2017 09:02 AM   Modules accepted: Orders

## 2017-07-17 LAB — PAP, TP IMAGING W/ HPV RNA, RFLX HPV TYPE 16,18/45: HPV DNA HIGH RISK: NOT DETECTED

## 2017-08-08 ENCOUNTER — Other Ambulatory Visit: Payer: Self-pay | Admitting: Women's Health

## 2017-08-08 DIAGNOSIS — Z1231 Encounter for screening mammogram for malignant neoplasm of breast: Secondary | ICD-10-CM

## 2017-08-15 ENCOUNTER — Ambulatory Visit
Admission: RE | Admit: 2017-08-15 | Discharge: 2017-08-15 | Disposition: A | Discharge status: Home / Self Care | Payer: BLUE CROSS/BLUE SHIELD | Source: Ambulatory Visit | Attending: Women's Health | Admitting: Women's Health

## 2017-08-15 DIAGNOSIS — Z1231 Encounter for screening mammogram for malignant neoplasm of breast: Secondary | ICD-10-CM

## 2017-08-16 ENCOUNTER — Encounter: Payer: Self-pay | Admitting: Women's Health

## 2018-02-10 DIAGNOSIS — L700 Acne vulgaris: Secondary | ICD-10-CM | POA: Diagnosis not present

## 2018-02-10 DIAGNOSIS — D225 Melanocytic nevi of trunk: Secondary | ICD-10-CM | POA: Diagnosis not present

## 2018-02-10 DIAGNOSIS — D2371 Other benign neoplasm of skin of right lower limb, including hip: Secondary | ICD-10-CM | POA: Diagnosis not present

## 2018-02-10 DIAGNOSIS — D2272 Melanocytic nevi of left lower limb, including hip: Secondary | ICD-10-CM | POA: Diagnosis not present

## 2018-07-20 ENCOUNTER — Other Ambulatory Visit: Payer: Self-pay | Admitting: Women's Health

## 2018-07-20 DIAGNOSIS — E282 Polycystic ovarian syndrome: Secondary | ICD-10-CM

## 2018-07-21 ENCOUNTER — Ambulatory Visit (INDEPENDENT_AMBULATORY_CARE_PROVIDER_SITE_OTHER): Payer: BLUE CROSS/BLUE SHIELD | Admitting: Women's Health

## 2018-07-21 ENCOUNTER — Encounter: Payer: Self-pay | Admitting: Women's Health

## 2018-07-21 VITALS — BP 122/80 | Ht 64.0 in | Wt 145.0 lb

## 2018-07-21 DIAGNOSIS — Z13220 Encounter for screening for lipoid disorders: Secondary | ICD-10-CM

## 2018-07-21 DIAGNOSIS — Z01419 Encounter for gynecological examination (general) (routine) without abnormal findings: Secondary | ICD-10-CM | POA: Diagnosis not present

## 2018-07-21 DIAGNOSIS — F323 Major depressive disorder, single episode, severe with psychotic features: Secondary | ICD-10-CM

## 2018-07-21 DIAGNOSIS — E282 Polycystic ovarian syndrome: Secondary | ICD-10-CM | POA: Diagnosis not present

## 2018-07-21 LAB — COMPREHENSIVE METABOLIC PANEL
AG RATIO: 2 (calc) (ref 1.0–2.5)
ALKALINE PHOSPHATASE (APISO): 52 U/L (ref 33–115)
ALT: 14 U/L (ref 6–29)
AST: 15 U/L (ref 10–30)
Albumin: 4.5 g/dL (ref 3.6–5.1)
BILIRUBIN TOTAL: 0.4 mg/dL (ref 0.2–1.2)
BUN: 11 mg/dL (ref 7–25)
CALCIUM: 9.5 mg/dL (ref 8.6–10.2)
CO2: 28 mmol/L (ref 20–32)
Chloride: 100 mmol/L (ref 98–110)
Creat: 0.92 mg/dL (ref 0.50–1.10)
Globulin: 2.2 g/dL (calc) (ref 1.9–3.7)
Glucose, Bld: 85 mg/dL (ref 65–99)
Potassium: 4 mmol/L (ref 3.5–5.3)
Sodium: 137 mmol/L (ref 135–146)
Total Protein: 6.7 g/dL (ref 6.1–8.1)

## 2018-07-21 LAB — LIPID PANEL
CHOL/HDL RATIO: 2.6 (calc) (ref <5.0)
CHOLESTEROL: 196 mg/dL (ref <200)
HDL: 74 mg/dL (ref >50)
LDL CHOLESTEROL (CALC): 99 mg/dL
Non-HDL Cholesterol (Calc): 122 mg/dL (calc) (ref <130)
Triglycerides: 136 mg/dL (ref <150)

## 2018-07-21 LAB — CBC WITH DIFFERENTIAL/PLATELET
BASOS ABS: 59 {cells}/uL (ref 0–200)
Basophils Relative: 1.1 %
EOS ABS: 81 {cells}/uL (ref 15–500)
EOS PCT: 1.5 %
HEMATOCRIT: 32.8 % — AB (ref 35.0–45.0)
HEMOGLOBIN: 10.7 g/dL — AB (ref 11.7–15.5)
LYMPHS ABS: 1777 {cells}/uL (ref 850–3900)
MCH: 25.1 pg — AB (ref 27.0–33.0)
MCHC: 32.6 g/dL (ref 32.0–36.0)
MCV: 77 fL — ABNORMAL LOW (ref 80.0–100.0)
MPV: 11.5 fL (ref 7.5–12.5)
Monocytes Relative: 8.7 %
NEUTROS PCT: 55.8 %
Neutro Abs: 3013 cells/uL (ref 1500–7800)
Platelets: 237 10*3/uL (ref 140–400)
RBC: 4.26 10*6/uL (ref 3.80–5.10)
RDW: 13.7 % (ref 11.0–15.0)
Total Lymphocyte: 32.9 %
WBC mixed population: 470 cells/uL (ref 200–950)
WBC: 5.4 10*3/uL (ref 3.8–10.8)

## 2018-07-21 MED ORDER — SERTRALINE HCL 50 MG PO TABS: 75.0000 mg | ORAL_TABLET | Freq: Every day | ORAL | 4 refills | Status: DC

## 2018-07-21 MED ORDER — SPIRONOLACTONE 25 MG PO TABS: 25.0000 mg | ORAL_TABLET | Freq: Every day | ORAL | 4 refills | Status: DC

## 2018-07-21 MED ORDER — NORETHIN-ETH ESTRAD-FE BIPHAS 1 MG-10 MCG / 10 MCG PO TABS: 1.0000 | ORAL_TABLET | Freq: Every day | ORAL | 4 refills | Status: DC

## 2018-07-21 NOTE — Progress Notes (Signed)
Hannah Flores 01-08-1976 [id number]    History:    Presents for annual exam.  Monthly cycle/condoms.  History of PCOS on Aldactone.  States has had increased acne since stopping OCs, and cycles are heavier stopped OCs due to headaches.  2002 CIN-3 LEEP with normal Paps after.  Anxiety depression stable on Zoloft.  Past medical history, past surgical history, family history and social history were all reviewed and documented in the EPIC chart.  Son is 5 and half in kindergarten doing well.  Husband history of opioid abuse has been clean and sober for 4 years.  ROS:  A ROS was performed and pertinent positives and negatives are included.  Exam:  Vitals:   07/21/18 0804  BP: 122/80  Weight: 145 lb (65.8 kg)  Height: 5\' 4"  (1.626 m)   Body mass index is 24.89 kg/m.   General appearance:  Normal Thyroid:  Symmetrical, normal in size, without palpable masses or nodularity. Respiratory  Auscultation:  Clear without wheezing or rhonchi Cardiovascular  Auscultation:  Regular rate, without rubs, murmurs or gallops  Edema/varicosities:  Not grossly evident Abdominal  Soft,nontender, without masses, guarding or rebound.  Liver/spleen:  No organomegaly noted  Hernia:  None appreciated  Skin  Inspection:  Grossly normal   Breasts: Examined lying and sitting.     Right: Without masses, retractions, discharge or axillary adenopathy.     Left: Without masses, retractions, discharge or axillary adenopathy. Gentitourinary   Inguinal/mons:  Normal without inguinal adenopathy  External genitalia:  Normal  BUS/Urethra/Skene's glands:  Normal  Vagina:  Normal  Cervix:  Normal  Uterus:  normal in size, shape and contour.  Midline and mobile  Adnexa/parametria:     Rt: Without masses or tenderness.   Lt: Without masses or tenderness.  Anus and perineum: Normal  Digital rectal exam: Normal sphincter tone without palpated masses or tenderness  Assessment/Plan:  42 y.o. MWF G1, P1 for annual  exam.    Contraception management Anxiety/depression stable on Zoloft PCOS 2002 CIN-3 LEEP normal Paps after  Plan: Options reviewed we will try lo Loestrin prescription, proper use given and reviewed slight risk for blood clots and strokes start up instructions reviewed condoms especially first month.  Instructed to call if continued problems with headaches.  Aldactone 25 mg p.o. daily prescription, proper use given and reviewed.  SBE's, continue annual screening mammograms, calcium rich foods, vitamin D 1000 daily continue regular exercise encouraged.  75 mg p.o. daily prescription, proper use given and reviewed.  Pap normal with negative HR HPV 2018, new screening guidelines reviewed. Repeat in one year.    Harrington Challenger [facility], 9:38 AM 07/21/2018

## 2018-07-21 NOTE — Patient Instructions (Addendum)
Placed with Cycle Dr Phineas Real to place  Levonorgestrel intrauterine device (IUD) What is this medicine? LEVONORGESTREL IUD (LEE voe nor jes trel) is a contraceptive (birth control) device. The device is placed inside the uterus by a healthcare professional. It is used to prevent pregnancy. This device can also be used to treat heavy bleeding that occurs during your period. This medicine may be used for other purposes; ask your health care provider or pharmacist if you have questions. COMMON BRAND NAME(S): Minette Headland What should I tell my health care provider before I take this medicine? They need to know if you have any of these conditions: -abnormal Pap smear -cancer of the breast, uterus, or cervix -diabetes -endometritis -genital or pelvic infection now or in the past -have more than one sexual partner or your partner has more than one partner -heart disease -history of an ectopic or tubal pregnancy -immune system problems -IUD in place -liver disease or tumor -problems with blood clots or take blood-thinners -seizures -use intravenous drugs -uterus of unusual shape -vaginal bleeding that has not been explained -an unusual or allergic reaction to levonorgestrel, other hormones, silicone, or polyethylene, medicines, foods, dyes, or preservatives -pregnant or trying to get pregnant -breast-feeding How should I use this medicine? This device is placed inside the uterus by a health care professional. Talk to your pediatrician regarding the use of this medicine in children. Special care may be needed. Overdosage: If you think you have taken too much of this medicine contact a poison control center or emergency room at once. NOTE: This medicine is only for you. Do not share this medicine with others. What if I miss a dose? This does not apply. Depending on the brand of device you have inserted, the device will need to be replaced every 3 to 5 years if you wish to  continue using this type of birth control. What may interact with this medicine? Do not take this medicine with any of the following medications: -amprenavir -bosentan -fosamprenavir This medicine may also interact with the following medications: -aprepitant -armodafinil -barbiturate medicines for inducing sleep or treating seizures -bexarotene -boceprevir -griseofulvin -medicines to treat seizures like carbamazepine, ethotoin, felbamate, oxcarbazepine, phenytoin, topiramate -modafinil -pioglitazone -rifabutin -rifampin -rifapentine -some medicines to treat HIV infection like atazanavir, efavirenz, indinavir, lopinavir, nelfinavir, tipranavir, ritonavir -St. John's wort -warfarin This list may not describe all possible interactions. Give your health care provider a list of all the medicines, herbs, non-prescription drugs, or dietary supplements you use. Also tell them if you smoke, drink alcohol, or use illegal drugs. Some items may interact with your medicine. What should I watch for while using this medicine? Visit your doctor or health care professional for regular check ups. See your doctor if you or your partner has sexual contact with others, becomes HIV positive, or gets a sexual transmitted disease. This product does not protect you against HIV infection (AIDS) or other sexually transmitted diseases. You can check the placement of the IUD yourself by reaching up to the top of your vagina with clean fingers to feel the threads. Do not pull on the threads. It is a good habit to check placement after each menstrual period. Call your doctor right away if you feel more of the IUD than just the threads or if you cannot feel the threads at all. The IUD may come out by itself. You may become pregnant if the device comes out. If you notice that the IUD has come out use a backup  birth control method like condoms and call your health care provider. Using tampons will not change the position  of the IUD and are okay to use during your period. This IUD can be safely scanned with magnetic resonance imaging (MRI) only under specific conditions. Before you have an MRI, tell your healthcare provider that you have an IUD in place, and which type of IUD you have in place. What side effects may I notice from receiving this medicine? Side effects that you should report to your doctor or health care professional as soon as possible: -allergic reactions like skin rash, itching or hives, swelling of the face, lips, or tongue -fever, flu-like symptoms -genital sores -high blood pressure -no menstrual period for 6 weeks during use -pain, swelling, warmth in the leg -pelvic pain or tenderness -severe or sudden headache -signs of pregnancy -stomach cramping -sudden shortness of breath -trouble with balance, talking, or walking -unusual vaginal bleeding, discharge -yellowing of the eyes or skin Side effects that usually do not require medical attention (report to your doctor or health care professional if they continue or are bothersome): -acne -breast pain -change in sex drive or performance -changes in weight -cramping, dizziness, or faintness while the device is being inserted -headache -irregular menstrual bleeding within first 3 to 6 months of use -nausea This list may not describe all possible side effects. Call your doctor for medical advice about side effects. You may report side effects to FDA at 1-800-FDA-1088. Where should I keep my medicine? This does not apply. NOTE: This sheet is a summary. It may not cover all possible information. If you have questions about this medicine, talk to your doctor, pharmacist, or health care provider.  2018 Elsevier/Gold Standard (2016-08-09 14:14:56)  Health Maintenance, Female Adopting a healthy lifestyle and getting preventive care can go a long way to promote health and wellness. Talk with your health care provider about what schedule of  regular examinations is right for you. This is a good chance for you to check in with your provider about disease prevention and staying healthy. In between checkups, there are plenty of things you can do on your own. Experts have done a lot of research about which lifestyle changes and preventive measures are most likely to keep you healthy. Ask your health care provider for more information. Weight and diet Eat a healthy diet  Be sure to include plenty of vegetables, fruits, low-fat dairy products, and lean protein.  Do not eat a lot of foods high in solid fats, added sugars, or salt.  Get regular exercise. This is one of the most important things you can do for your health. ? Most adults should exercise for at least 150 minutes each week. The exercise should increase your heart rate and make you sweat (moderate-intensity exercise). ? Most adults should also do strengthening exercises at least twice a week. This is in addition to the moderate-intensity exercise.  Maintain a healthy weight  Body mass index (BMI) is a measurement that can be used to identify possible weight problems. It estimates body fat based on height and weight. Your health care provider can help determine your BMI and help you achieve or maintain a healthy weight.  For females 72 years of age and older: ? A BMI below 18.5 is considered underweight. ? A BMI of 18.5 to 24.9 is normal. ? A BMI of 25 to 29.9 is considered overweight. ? A BMI of 30 and above is considered obese.  Watch levels of cholesterol  and blood lipids  You should start having your blood tested for lipids and cholesterol at 42 years of age, then have this test every 5 years.  You may need to have your cholesterol levels checked more often if: ? Your lipid or cholesterol levels are high. ? You are older than 42 years of age. ? You are at high risk for heart disease.  Cancer screening Lung Cancer  Lung cancer screening is recommended for adults  83-70 years old who are at high risk for lung cancer because of a history of smoking.  A yearly low-dose CT scan of the lungs is recommended for people who: ? Currently smoke. ? Have quit within the past 15 years. ? Have at least a 30-pack-year history of smoking. A pack year is smoking an average of one pack of cigarettes a day for 1 year.  Yearly screening should continue until it has been 15 years since you quit.  Yearly screening should stop if you develop a health problem that would prevent you from having lung cancer treatment.  Breast Cancer  Practice breast self-awareness. This means understanding how your breasts normally appear and feel.  It also means doing regular breast self-exams. Let your health care provider know about any changes, no matter how small.  If you are in your 20s or 30s, you should have a clinical breast exam (CBE) by a health care provider every 1-3 years as part of a regular health exam.  If you are 1 or older, have a CBE every year. Also consider having a breast X-ray (mammogram) every year.  If you have a family history of breast cancer, talk to your health care provider about genetic screening.  If you are at high risk for breast cancer, talk to your health care provider about having an MRI and a mammogram every year.  Breast cancer gene (BRCA) assessment is recommended for women who have family members with BRCA-related cancers. BRCA-related cancers include: ? Breast. ? Ovarian. ? Tubal. ? Peritoneal cancers.  Results of the assessment will determine the need for genetic counseling and BRCA1 and BRCA2 testing.  Cervical Cancer Your health care provider may recommend that you be screened regularly for cancer of the pelvic organs (ovaries, uterus, and vagina). This screening involves a pelvic examination, including checking for microscopic changes to the surface of your cervix (Pap test). You may be encouraged to have this screening done every 3  years, beginning at age 74.  For women ages 53-65, health care providers may recommend pelvic exams and Pap testing every 3 years, or they may recommend the Pap and pelvic exam, combined with testing for human papilloma virus (HPV), every 5 years. Some types of HPV increase your risk of cervical cancer. Testing for HPV may also be done on women of any age with unclear Pap test results.  Other health care providers may not recommend any screening for nonpregnant women who are considered low risk for pelvic cancer and who do not have symptoms. Ask your health care provider if a screening pelvic exam is right for you.  If you have had past treatment for cervical cancer or a condition that could lead to cancer, you need Pap tests and screening for cancer for at least 20 years after your treatment. If Pap tests have been discontinued, your risk factors (such as having a new sexual partner) need to be reassessed to determine if screening should resume. Some women have medical problems that increase the chance of getting  cervical cancer. In these cases, your health care provider may recommend more frequent screening and Pap tests.  Colorectal Cancer  This type of cancer can be detected and often prevented.  Routine colorectal cancer screening usually begins at 42 years of age and continues through 42 years of age.  Your health care provider may recommend screening at an earlier age if you have risk factors for colon cancer.  Your health care provider may also recommend using home test kits to check for hidden blood in the stool.  A small camera at the end of a tube can be used to examine your colon directly (sigmoidoscopy or colonoscopy). This is done to check for the earliest forms of colorectal cancer.  Routine screening usually begins at age 83.  Direct examination of the colon should be repeated every 5-10 years through 42 years of age. However, you may need to be screened more often if early  forms of precancerous polyps or small growths are found.  Skin Cancer  Check your skin from head to toe regularly.  Tell your health care provider about any new moles or changes in moles, especially if there is a change in a mole's shape or color.  Also tell your health care provider if you have a mole that is larger than the size of a pencil eraser.  Always use sunscreen. Apply sunscreen liberally and repeatedly throughout the day.  Protect yourself by wearing long sleeves, pants, a wide-brimmed hat, and sunglasses whenever you are outside.  Heart disease, diabetes, and high blood pressure  High blood pressure causes heart disease and increases the risk of stroke. High blood pressure is more likely to develop in: ? People who have blood pressure in the high end of the normal range (130-139/85-89 mm Hg). ? People who are overweight or obese. ? People who are African American.  If you are 64-42 years of age, have your blood pressure checked every 3-5 years. If you are 38 years of age or older, have your blood pressure checked every year. You should have your blood pressure measured twice-once when you are at a hospital or clinic, and once when you are not at a hospital or clinic. Record the average of the two measurements. To check your blood pressure when you are not at a hospital or clinic, you can use: ? An automated blood pressure machine at a pharmacy. ? A home blood pressure monitor.  If you are between 19 years and 76 years old, ask your health care provider if you should take aspirin to prevent strokes.  Have regular diabetes screenings. This involves taking a blood sample to check your fasting blood sugar level. ? If you are at a normal weight and have a low risk for diabetes, have this test once every three years after 42 years of age. ? If you are overweight and have a high risk for diabetes, consider being tested at a younger age or more often. Preventing infection Hepatitis  B  If you have a higher risk for hepatitis B, you should be screened for this virus. You are considered at high risk for hepatitis B if: ? You were born in a country where hepatitis B is common. Ask your health care provider which countries are considered high risk. ? Your parents were born in a high-risk country, and you have not been immunized against hepatitis B (hepatitis B vaccine). ? You have HIV or AIDS. ? You use needles to inject street drugs. ? You live  with someone who has hepatitis B. ? You have had sex with someone who has hepatitis B. ? You get hemodialysis treatment. ? You take certain medicines for conditions, including cancer, organ transplantation, and autoimmune conditions.  Hepatitis C  Blood testing is recommended for: ? Everyone born from [birth year] through 1965. ? Anyone with known risk factors for hepatitis C.  Sexually transmitted infections (STIs)  You should be screened for sexually transmitted infections (STIs) including gonorrhea and chlamydia if: ? You are sexually active and are younger than 42 years of age. ? You are older than 42 years of age and your health care provider tells you that you are at risk for this type of infection. ? Your sexual activity has changed since you were last screened and you are at an increased risk for chlamydia or gonorrhea. Ask your health care provider if you are at risk.  If you do not have HIV, but are at risk, it may be recommended that you take a prescription medicine daily to prevent HIV infection. This is called pre-exposure prophylaxis (PrEP). You are considered at risk if: ? You are sexually active and do not regularly use condoms or know the HIV status of your partner(s). ? You take drugs by injection. ? You are sexually active with a partner who has HIV.  Talk with your health care provider about whether you are at high risk of being infected with HIV. If you choose to begin PrEP, you should first be tested for HIV. You  should then be tested every 3 months for as long as you are taking PrEP. Pregnancy  If you are premenopausal and you may become pregnant, ask your health care provider about preconception counseling.  If you may become pregnant, take 400 to 800 micrograms (mcg) of folic acid every day.  If you want to prevent pregnancy, talk to your health care provider about birth control (contraception). Osteoporosis and menopause  Osteoporosis is a disease in which the bones lose minerals and strength with aging. This can result in serious bone fractures. Your risk for osteoporosis can be identified using a bone density scan.  If you are 65 years of age or older, or if you are at risk for osteoporosis and fractures, ask your health care provider if you should be screened.  Ask your health care provider whether you should take a calcium or vitamin D supplement to lower your risk for osteoporosis.  Menopause may have certain physical symptoms and risks.  Hormone replacement therapy may reduce some of these symptoms and risks. Talk to your health care provider about whether hormone replacement therapy is right for you. Follow these instructions at home:  Schedule regular health, dental, and eye exams.  Stay current with your immunizations.  Do not use any tobacco products including cigarettes, chewing tobacco, or electronic cigarettes.  If you are pregnant, do not drink alcohol.  If you are breastfeeding, limit how much and how often you drink alcohol.  Limit alcohol intake to no more than 1 drink per day for nonpregnant women. One drink equals 12 ounces of beer, 5 ounces of wine, or 1 ounces of hard liquor.  Do not use street drugs.  Do not share needles.  Ask your health care provider for help if you need support or information about quitting drugs.  Tell your health care provider if you often feel depressed.  Tell your health care provider if you have ever been abused or do not feel safe  at home.  This information is not intended to replace advice given to you by your health care provider. Make sure you discuss any questions you have with your health care provider. Document Released: 05/13/2011 Document Revised: 04/04/2016 Document Reviewed: 08/01/2015 Elsevier Interactive Patient Education  [employer].

## 2018-09-29 ENCOUNTER — Telehealth: Payer: Self-pay

## 2018-09-29 NOTE — Telephone Encounter (Signed)
Spoke with patient today by phone and relayed this message to her.

## 2018-09-29 NOTE — Telephone Encounter (Signed)
Patient called. I relayed the My Chart message to her "Hannah RowanNancy Young, NP reviewed your labwork and wrote " Blood sugar, electrolytes and lipid panel all great. Hemoglobin and hematocrit are still low. Ask if she is taking an over-the-counter iron supplement such as Slow FE? Work on trying to increase iron rich foods such as dark green leafy vegetables daily."   She said she is not currently taking iron supplement but promises she will get one and take it daily. She said she had been eating lots of spinach lately and will continue to work on getting iron rich foods in her diet.

## 2018-09-29 NOTE — Telephone Encounter (Signed)
Not needed

## 2018-09-29 NOTE — Telephone Encounter (Addendum)
I called patient and left message that My Chart message sent 07/22/18 has not been read yet. I asked her to call me to discuss result or go to My Chart and review and let me know she read it.

## 2018-10-16 ENCOUNTER — Other Ambulatory Visit: Payer: Self-pay | Admitting: Women's Health

## 2018-10-16 DIAGNOSIS — Z1231 Encounter for screening mammogram for malignant neoplasm of breast: Secondary | ICD-10-CM

## 2018-11-25 ENCOUNTER — Ambulatory Visit
Admission: RE | Admit: 2018-11-25 | Discharge: 2018-11-25 | Disposition: A | Discharge status: Home / Self Care | Payer: BLUE CROSS/BLUE SHIELD | Source: Ambulatory Visit | Attending: Women's Health | Admitting: Women's Health

## 2018-11-25 DIAGNOSIS — Z1231 Encounter for screening mammogram for malignant neoplasm of breast: Secondary | ICD-10-CM

## 2019-01-27 DIAGNOSIS — D225 Melanocytic nevi of trunk: Secondary | ICD-10-CM | POA: Diagnosis not present

## 2019-01-27 DIAGNOSIS — L814 Other melanin hyperpigmentation: Secondary | ICD-10-CM | POA: Diagnosis not present

## 2019-07-27 ENCOUNTER — Encounter: Payer: BC Managed Care – PPO | Admitting: Women's Health

## 2019-09-10 ENCOUNTER — Other Ambulatory Visit: Payer: Self-pay

## 2019-09-10 DIAGNOSIS — F323 Major depressive disorder, single episode, severe with psychotic features: Secondary | ICD-10-CM

## 2019-09-10 MED ORDER — SERTRALINE HCL 50 MG PO TABS: 75.0000 mg | ORAL_TABLET | Freq: Every day | ORAL | 0 refills | Status: DC

## 2019-09-10 NOTE — Telephone Encounter (Signed)
Requesting refill.  CE scheduled 09/14/2019.

## 2019-09-13 ENCOUNTER — Other Ambulatory Visit: Payer: Self-pay

## 2019-09-14 ENCOUNTER — Ambulatory Visit (INDEPENDENT_AMBULATORY_CARE_PROVIDER_SITE_OTHER): Payer: BC Managed Care – PPO | Admitting: Women's Health

## 2019-09-14 ENCOUNTER — Encounter: Payer: Self-pay | Admitting: Women's Health

## 2019-09-14 VITALS — BP 118/78 | Ht 64.0 in | Wt 151.0 lb

## 2019-09-14 DIAGNOSIS — Z01419 Encounter for gynecological examination (general) (routine) without abnormal findings: Secondary | ICD-10-CM | POA: Diagnosis not present

## 2019-09-14 DIAGNOSIS — F323 Major depressive disorder, single episode, severe with psychotic features: Secondary | ICD-10-CM | POA: Diagnosis not present

## 2019-09-14 DIAGNOSIS — E282 Polycystic ovarian syndrome: Secondary | ICD-10-CM | POA: Diagnosis not present

## 2019-09-14 DIAGNOSIS — Z13220 Encounter for screening for lipoid disorders: Secondary | ICD-10-CM | POA: Diagnosis not present

## 2019-09-14 LAB — LIPID PANEL
Cholesterol: 206 mg/dL — ABNORMAL HIGH (ref <200)
HDL: 74 mg/dL (ref >=50)
LDL Cholesterol (Calc): 112 mg/dL (calc) — ABNORMAL HIGH
Non-HDL Cholesterol (Calc): 132 mg/dL (calc) — ABNORMAL HIGH (ref <130)
Total CHOL/HDL Ratio: 2.8 (calc) (ref <5.0)
Triglycerides: 95 mg/dL (ref <150)

## 2019-09-14 LAB — COMPREHENSIVE METABOLIC PANEL
AG Ratio: 1.9 (calc) (ref 1.0–2.5)
ALT: 15 U/L (ref 6–29)
AST: 14 U/L (ref 10–30)
Albumin: 4.6 g/dL (ref 3.6–5.1)
Alkaline phosphatase (APISO): 53 U/L (ref 31–125)
BUN: 17 mg/dL (ref 7–25)
CO2: 28 mmol/L (ref 20–32)
Calcium: 9.6 mg/dL (ref 8.6–10.2)
Chloride: 100 mmol/L (ref 98–110)
Creat: 0.8 mg/dL (ref 0.50–1.10)
Globulin: 2.4 g/dL (calc) (ref 1.9–3.7)
Glucose, Bld: 76 mg/dL (ref 65–99)
Potassium: 3.9 mmol/L (ref 3.5–5.3)
Sodium: 136 mmol/L (ref 135–146)
Total Bilirubin: 0.4 mg/dL (ref 0.2–1.2)
Total Protein: 7 g/dL (ref 6.1–8.1)

## 2019-09-14 LAB — CBC WITH DIFFERENTIAL/PLATELET
Absolute Monocytes: 484 cells/uL (ref 200–950)
Basophils Absolute: 72 cells/uL (ref 0–200)
Basophils Relative: 1.3 %
Eosinophils Absolute: 99 cells/uL (ref 15–500)
Eosinophils Relative: 1.8 %
HCT: 32.5 % — ABNORMAL LOW (ref 35.0–45.0)
Hemoglobin: 10.4 g/dL — ABNORMAL LOW (ref 11.7–15.5)
Lymphs Abs: 1848 cells/uL (ref 850–3900)
MCH: 24.4 pg — ABNORMAL LOW (ref 27.0–33.0)
MCHC: 32 g/dL (ref 32.0–36.0)
MCV: 76.3 fL — ABNORMAL LOW (ref 80.0–100.0)
MPV: 11.3 fL (ref 7.5–12.5)
Monocytes Relative: 8.8 %
Neutro Abs: 2998 cells/uL (ref 1500–7800)
Neutrophils Relative %: 54.5 %
Platelets: 263 10*3/uL (ref 140–400)
RBC: 4.26 10*6/uL (ref 3.80–5.10)
RDW: 14.4 % (ref 11.0–15.0)
Total Lymphocyte: 33.6 %
WBC: 5.5 10*3/uL (ref 3.8–10.8)

## 2019-09-14 MED ORDER — SPIRONOLACTONE 25 MG PO TABS: 25.0000 mg | ORAL_TABLET | Freq: Every day | ORAL | 4 refills | Status: DC

## 2019-09-14 MED ORDER — ADAPALENE 0.1 % EX GEL: Freq: Every day | CUTANEOUS | 12 refills | Status: AC

## 2019-09-14 MED ORDER — SERTRALINE HCL 50 MG PO TABS: 75.0000 mg | ORAL_TABLET | Freq: Every day | ORAL | 4 refills | Status: DC

## 2019-09-14 NOTE — Patient Instructions (Signed)
Health Maintenance, Female Adopting a healthy lifestyle and getting preventive care are important in promoting health and wellness. Ask your health care provider about:  The right schedule for you to have regular tests and exams.  Things you can do on your own to prevent diseases and keep yourself healthy. What should I know about diet, weight, and exercise? Eat a healthy diet   Eat a diet that includes plenty of vegetables, fruits, low-fat dairy products, and lean protein.  Do not eat a lot of foods that are high in solid fats, added sugars, or sodium. Maintain a healthy weight Body mass index (BMI) is used to identify weight problems. It estimates body fat based on height and weight. Your health care provider can help determine your BMI and help you achieve or maintain a healthy weight. Get regular exercise Get regular exercise. This is one of the most important things you can do for your health. Most adults should:  Exercise for at least 150 minutes each week. The exercise should increase your heart rate and make you sweat (moderate-intensity exercise).  Do strengthening exercises at least twice a week. This is in addition to the moderate-intensity exercise.  Spend less time sitting. Even light physical activity can be beneficial. Watch cholesterol and blood lipids Have your blood tested for lipids and cholesterol at 43 years of age, then have this test every 5 years. Have your cholesterol levels checked more often if:  Your lipid or cholesterol levels are high.  You are older than 43 years of age.  You are at high risk for heart disease. What should I know about cancer screening? Depending on your health history and family history, you may need to have cancer screening at various ages. This may include screening for:  Breast cancer.  Cervical cancer.  Colorectal cancer.  Skin cancer.  Lung cancer. What should I know about heart disease, diabetes, and high blood  pressure? Blood pressure and heart disease  High blood pressure causes heart disease and increases the risk of stroke. This is more likely to develop in people who have high blood pressure readings, are of African descent, or are overweight.  Have your blood pressure checked: ? Every 3-5 years if you are 18-39 years of age. ? Every year if you are 40 years old or older. Diabetes Have regular diabetes screenings. This checks your fasting blood sugar level. Have the screening done:  Once every three years after age 40 if you are at a normal weight and have a low risk for diabetes.  More often and at a younger age if you are overweight or have a high risk for diabetes. What should I know about preventing infection? Hepatitis B If you have a higher risk for hepatitis B, you should be screened for this virus. Talk with your health care provider to find out if you are at risk for hepatitis B infection. Hepatitis C Testing is recommended for:  Everyone born from 1945 through 1965.  Anyone with known risk factors for hepatitis C. Sexually transmitted infections (STIs)  Get screened for STIs, including gonorrhea and chlamydia, if: ? You are sexually active and are younger than 43 years of age. ? You are older than 43 years of age and your health care provider tells you that you are at risk for this type of infection. ? Your sexual activity has changed since you were last screened, and you are at increased risk for chlamydia or gonorrhea. Ask your health care provider if   you are at risk.  Ask your health care provider about whether you are at high risk for HIV. Your health care provider may recommend a prescription medicine to help prevent HIV infection. If you choose to take medicine to prevent HIV, you should first get tested for HIV. You should then be tested every 3 months for as long as you are taking the medicine. Pregnancy  If you are about to stop having your period (premenopausal) and  you may become pregnant, seek counseling before you get pregnant.  Take 400 to 800 micrograms (mcg) of folic acid every day if you become pregnant.  Ask for birth control (contraception) if you want to prevent pregnancy. Osteoporosis and menopause Osteoporosis is a disease in which the bones lose minerals and strength with aging. This can result in bone fractures. If you are 65 years old or older, or if you are at risk for osteoporosis and fractures, ask your health care provider if you should:  Be screened for bone loss.  Take a calcium or vitamin D supplement to lower your risk of fractures.  Be given hormone replacement therapy (HRT) to treat symptoms of menopause. Follow these instructions at home: Lifestyle  Do not use any products that contain nicotine or tobacco, such as cigarettes, e-cigarettes, and chewing tobacco. If you need help quitting, ask your health care provider.  Do not use street drugs.  Do not share needles.  Ask your health care provider for help if you need support or information about quitting drugs. Alcohol use  Do not drink alcohol if: ? Your health care provider tells you not to drink. ? You are pregnant, may be pregnant, or are planning to become pregnant.  If you drink alcohol: ? Limit how much you use to 0-1 drink a day. ? Limit intake if you are breastfeeding.  Be aware of how much alcohol is in your drink. In the [place] one drink equals one 12 oz bottle of beer (355 mL), one 5 oz glass of wine (148 mL), or one 1 oz glass of hard liquor (44 mL). General instructions  Schedule regular health, dental, and eye exams.  Stay current with your vaccines.  Tell your health care provider if: ? You often feel depressed. ? You have ever been abused or do not feel safe at home. Summary  Adopting a healthy lifestyle and getting preventive care are important in promoting health and wellness.  Follow your health care provider's instructions about healthy  diet, exercising, and getting tested or screened for diseases.  Follow your health care provider's instructions on monitoring your cholesterol and blood pressure. This information is not intended to replace advice given to you by your health care provider. Make sure you discuss any questions you have with your health care provider. Document Released: 05/13/2011 Document Revised: 10/21/2018 Document Reviewed: 10/21/2018 Elsevier Patient Education  2020 Elsevier Inc.  

## 2019-09-14 NOTE — Progress Notes (Signed)
Hannah Flores 01-26-1976 [id number]    History:    Presents for annual exam.  Monthly cycle/condoms.  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  Normal mammogram history.  History of anxiety and depression stable on zoloft.  Past medical history, past surgical history, family history and social history were all reviewed and documented in the EPIC chart.  Works from home.  Does Jazzercise on a regular basis.  [name] 6 doing well.  Husband history of opioid/ alcohol abuse clean and sober for greater than 5 years, scheduled for neck surgery this week and plans to use no pain medication other than Motrin.    ROS:  A ROS was performed and pertinent positives and negatives are included.  Exam:  Vitals:   09/14/19 0758  BP: 118/78  Weight: 151 lb (68.5 kg)  Height: 5\' 4"  (1.626 m)   Body mass index is 25.92 kg/m.   General appearance:  Normal Thyroid:  Symmetrical, normal in size, without palpable masses or nodularity. Respiratory  Auscultation:  Clear without wheezing or rhonchi Cardiovascular  Auscultation:  Regular rate, without rubs, murmurs or gallops  Edema/varicosities:  Not grossly evident Abdominal  Soft,nontender, without masses, guarding or rebound.  Liver/spleen:  No organomegaly noted  Hernia:  None appreciated  Skin  Inspection:  Grossly normal   Breasts: Examined lying and sitting.     Right: Without masses, retractions, discharge or axillary adenopathy.     Left: Without masses, retractions, discharge or axillary adenopathy. Gentitourinary   Inguinal/mons:  Normal without inguinal adenopathy  External genitalia:  Normal  BUS/Urethra/Skene's glands:  Normal  Vagina:  Normal  Cervix:  Normal  Uterus:   normal in size, shape and contour.  Midline and mobile  Adnexa/parametria:     Rt: Without masses or tenderness.   Lt: Without masses or tenderness.  Anus and perineum: Normal  Digital rectal exam: Normal sphincter tone without palpated masses or  tenderness  Assessment/Plan:  43 y.o. MWF G1 P1 for annual exam with no complaints other than increased acne since stopping OCs.  Monthly cycle/condoms Spironolactone/PCOS Anxiety/depression stable on Zoloft 75 mg 2002 CIN-2 LEEP normal Paps after  Plan: Zoloft 75 mg p.o. daily prescription, proper use given and reviewed, counseling as needed has had in the past.  Continue regular exercise, self-care, leisure activities encouraged.  Contraception discussed, declines will continue condoms.  Spironolactone 25 mg daily prescription, proper use given and reviewed, Differin 0.1  1% apply daily at at bedtime, if no relief with acne follow-up with dermatologist. SBEs, continue annual screening mammogram, calcium rich foods, vitamin D 1000 daily encouraged.  CBC, lipid panel, CMP.  Pap with HR HPV typing.  [place], 8:28 AM 09/14/2019

## 2019-09-15 LAB — PAP, TP IMAGING W/ HPV RNA, RFLX HPV TYPE 16,18/45: HPV DNA High Risk: NOT DETECTED

## 2019-09-30 NOTE — Telephone Encounter (Signed)
I called patient and per DPR access note on file I read her the My Chart message from [place] that was returned unread.

## 2019-12-27 ENCOUNTER — Other Ambulatory Visit: Payer: Self-pay | Admitting: Women's Health

## 2019-12-27 DIAGNOSIS — Z1231 Encounter for screening mammogram for malignant neoplasm of breast: Secondary | ICD-10-CM

## 2019-12-30 ENCOUNTER — Ambulatory Visit: Payer: BLUE CROSS/BLUE SHIELD

## 2020-02-04 ENCOUNTER — Other Ambulatory Visit: Payer: Self-pay

## 2020-02-04 ENCOUNTER — Ambulatory Visit
Admission: RE | Admit: 2020-02-04 | Discharge: 2020-02-04 | Disposition: A | Discharge status: Home / Self Care | Payer: BC Managed Care – PPO | Source: Ambulatory Visit | Attending: Women's Health | Admitting: Women's Health

## 2020-02-04 DIAGNOSIS — Z1231 Encounter for screening mammogram for malignant neoplasm of breast: Secondary | ICD-10-CM

## 2020-09-19 ENCOUNTER — Other Ambulatory Visit: Payer: Self-pay

## 2020-09-19 ENCOUNTER — Ambulatory Visit (INDEPENDENT_AMBULATORY_CARE_PROVIDER_SITE_OTHER): Payer: BC Managed Care – PPO | Admitting: Nurse Practitioner

## 2020-09-19 ENCOUNTER — Encounter: Payer: Self-pay | Admitting: Nurse Practitioner

## 2020-09-19 VITALS — BP 120/78 | Ht 64.0 in | Wt 124.0 lb

## 2020-09-19 DIAGNOSIS — E282 Polycystic ovarian syndrome: Secondary | ICD-10-CM

## 2020-09-19 DIAGNOSIS — N926 Irregular menstruation, unspecified: Secondary | ICD-10-CM | POA: Diagnosis not present

## 2020-09-19 DIAGNOSIS — F418 Other specified anxiety disorders: Secondary | ICD-10-CM

## 2020-09-19 DIAGNOSIS — Z01419 Encounter for gynecological examination (general) (routine) without abnormal findings: Secondary | ICD-10-CM

## 2020-09-19 MED ORDER — SPIRONOLACTONE 50 MG PO TABS: 50.0000 mg | ORAL_TABLET | Freq: Every day | ORAL | 3 refills | Status: DC

## 2020-09-19 MED ORDER — SERTRALINE HCL 50 MG PO TABS: 75.0000 mg | ORAL_TABLET | Freq: Every day | ORAL | 4 refills | Status: DC

## 2020-09-19 NOTE — Progress Notes (Signed)
   Hannah Flores Apr 19, 1976 [id number]   History:  44 y.o. G1P1001 presents for annual exam. Has started having irregular cycles. Cycle in April, then again in August and September. No menopausal symptoms. She has had some increased stress in her life with husband's health. Not sexually active due to his health. 2002 LEEP CIN-2, subsequent paps normal. Normal mammogram history. PCOS, on spironolactone. Anxiety/depression stable on Zoloft.   Gynecologic History Patient's last menstrual period was 07/26/2020. Period Pattern: (!) Irregular Menstrual Flow: Moderate Menstrual Control: Maxi pad, Tampon Dysmenorrhea: (!) Mild Dysmenorrhea Symptoms: Cramping Contraception: abstinence Last Pap: 09/14/2019. Results were: normal Last mammogram: 02/07/2020. Results were: normal   Past medical history, past surgical history, family history and social history were all reviewed and documented in the EPIC chart.  ROS:  A ROS was performed and pertinent positives and negatives are included.  Exam:  Vitals:   09/19/20 0803  BP: 120/78  Weight: 124 lb (56.2 kg)  Height: 5\' 4"  (1.626 m)   Body mass index is 21.28 kg/m.  General appearance:  Normal Thyroid:  Symmetrical, normal in size, without palpable masses or nodularity. Respiratory  Auscultation:  Clear without wheezing or rhonchi Cardiovascular  Auscultation:  Regular rate, without rubs, murmurs or gallops  Edema/varicosities:  Not grossly evident Abdominal  Soft,nontender, without masses, guarding or rebound.  Liver/spleen:  No organomegaly noted  Hernia:  None appreciated  Skin  Inspection:  Grossly normal   Breasts: Examined lying and sitting.   Right: Without masses, retractions, discharge or axillary adenopathy.   Left: Without masses, retractions, discharge or axillary adenopathy. Gentitourinary   Inguinal/mons:  Normal without inguinal adenopathy  External genitalia:  Normal  BUS/Urethra/Skene's glands:  Normal  Vagina:   Normal  Cervix:  Normal  Uterus:  Normal in size, shape and contour.  Midline and mobile  Adnexa/parametria:     Rt: Without masses or tenderness.   Lt: Without masses or tenderness.  Anus and perineum: Normal  Assessment/Plan:  44 y.o. G1P1001 for annual exam.   Well female exam with routine gynecological exam - Plan: CBC with Differential/Platelet, Comprehensive metabolic panel, Lipid panel. Education provided on SBEs, importance of preventative screenings, current guidelines, high calcium diet, regular exercise, and multivitamin daily.   PCOS (polycystic ovarian syndrome) - Plan: spironolactone (ALDACTONE) 50 MG tablet. She has noticed an increase in acne. We will increase dose to 50 mg daily. She is aware this may lower her BP so I recommend checking it routinely at first and if she experiences dizziness or lightheadedness to lower dose back to 25 mg. She is agreeable.   Anxiety with depression - Plan: sertraline (ZOLOFT) 50 MG tablet, take 1.5 tablets daily. She is doing well on this and would like to continue.   Irregular menstrual cycle - Plan: Follicle stimulating hormone. Newly irregular cycles over the last year. Denies menopausal symptoms. Has had increased stress that may be affecting her cycles.   Screening for cervical cancer -2002 LEEP CIN-2, subsequent Paps normal.  Will repeat Pap at 3-year interval.  Screening for breast cancer -normal mammogram history.  Continue annual normal breast exam today.  Follow-up in 1 year for annual.      [facility], 8:13 AM 09/19/2020

## 2020-09-19 NOTE — Patient Instructions (Signed)
Health Maintenance, Female Adopting a healthy lifestyle and getting preventive care are important in promoting health and wellness. Ask your health care provider about:  The right schedule for you to have regular tests and exams.  Things you can do on your own to prevent diseases and keep yourself healthy. What should I know about diet, weight, and exercise? Eat a healthy diet   Eat a diet that includes plenty of vegetables, fruits, low-fat dairy products, and lean protein.  Do not eat a lot of foods that are high in solid fats, added sugars, or sodium. Maintain a healthy weight Body mass index (BMI) is used to identify weight problems. It estimates body fat based on height and weight. Your health care provider can help determine your BMI and help you achieve or maintain a healthy weight. Get regular exercise Get regular exercise. This is one of the most important things you can do for your health. Most adults should:  Exercise for at least 150 minutes each week. The exercise should increase your heart rate and make you sweat (moderate-intensity exercise).  Do strengthening exercises at least twice a week. This is in addition to the moderate-intensity exercise.  Spend less time sitting. Even light physical activity can be beneficial. Watch cholesterol and blood lipids Have your blood tested for lipids and cholesterol at 44 years of age, then have this test every 5 years. Have your cholesterol levels checked more often if:  Your lipid or cholesterol levels are high.  You are older than 44 years of age.  You are at high risk for heart disease. What should I know about cancer screening? Depending on your health history and family history, you may need to have cancer screening at various ages. This may include screening for:  Breast cancer.  Cervical cancer.  Colorectal cancer.  Skin cancer.  Lung cancer. What should I know about heart disease, diabetes, and high blood  pressure? Blood pressure and heart disease  High blood pressure causes heart disease and increases the risk of stroke. This is more likely to develop in people who have high blood pressure readings, are of African descent, or are overweight.  Have your blood pressure checked: ? Every 3-5 years if you are 18-39 years of age. ? Every year if you are 40 years old or older. Diabetes Have regular diabetes screenings. This checks your fasting blood sugar level. Have the screening done:  Once every three years after age 40 if you are at a normal weight and have a low risk for diabetes.  More often and at a younger age if you are overweight or have a high risk for diabetes. What should I know about preventing infection? Hepatitis B If you have a higher risk for hepatitis B, you should be screened for this virus. Talk with your health care provider to find out if you are at risk for hepatitis B infection. Hepatitis C Testing is recommended for:  Everyone born from 1945 through 1965.  Anyone with known risk factors for hepatitis C. Sexually transmitted infections (STIs)  Get screened for STIs, including gonorrhea and chlamydia, if: ? You are sexually active and are younger than 44 years of age. ? You are older than 44 years of age and your health care provider tells you that you are at risk for this type of infection. ? Your sexual activity has changed since you were last screened, and you are at increased risk for chlamydia or gonorrhea. Ask your health care provider if   you are at risk.  Ask your health care provider about whether you are at high risk for HIV. Your health care provider may recommend a prescription medicine to help prevent HIV infection. If you choose to take medicine to prevent HIV, you should first get tested for HIV. You should then be tested every 3 months for as long as you are taking the medicine. Pregnancy  If you are about to stop having your period (premenopausal) and  you may become pregnant, seek counseling before you get pregnant.  Take 400 to 800 micrograms (mcg) of folic acid every day if you become pregnant.  Ask for birth control (contraception) if you want to prevent pregnancy. Osteoporosis and menopause Osteoporosis is a disease in which the bones lose minerals and strength with aging. This can result in bone fractures. If you are 65 years old or older, or if you are at risk for osteoporosis and fractures, ask your health care provider if you should:  Be screened for bone loss.  Take a calcium or vitamin D supplement to lower your risk of fractures.  Be given hormone replacement therapy (HRT) to treat symptoms of menopause. Follow these instructions at home: Lifestyle  Do not use any products that contain nicotine or tobacco, such as cigarettes, e-cigarettes, and chewing tobacco. If you need help quitting, ask your health care provider.  Do not use street drugs.  Do not share needles.  Ask your health care provider for help if you need support or information about quitting drugs. Alcohol use  Do not drink alcohol if: ? Your health care provider tells you not to drink. ? You are pregnant, may be pregnant, or are planning to become pregnant.  If you drink alcohol: ? Limit how much you use to 0-1 drink a day. ? Limit intake if you are breastfeeding.  Be aware of how much alcohol is in your drink. In the [place] one drink equals one 12 oz bottle of beer (355 mL), one 5 oz glass of wine (148 mL), or one 1 oz glass of hard liquor (44 mL). General instructions  Schedule regular health, dental, and eye exams.  Stay current with your vaccines.  Tell your health care provider if: ? You often feel depressed. ? You have ever been abused or do not feel safe at home. Summary  Adopting a healthy lifestyle and getting preventive care are important in promoting health and wellness.  Follow your health care provider's instructions about healthy  diet, exercising, and getting tested or screened for diseases.  Follow your health care provider's instructions on monitoring your cholesterol and blood pressure. This information is not intended to replace advice given to you by your health care provider. Make sure you discuss any questions you have with your health care provider. Document Revised: 10/21/2018 Document Reviewed: 10/21/2018 Elsevier Patient Education  2020 Elsevier Inc.  

## 2020-09-20 LAB — CBC WITH DIFFERENTIAL/PLATELET
Absolute Monocytes: 428 cells/uL (ref 200–950)
Basophils Absolute: 38 cells/uL (ref 0–200)
Basophils Relative: 0.8 %
Eosinophils Absolute: 118 cells/uL (ref 15–500)
Eosinophils Relative: 2.5 %
HCT: 38.9 % (ref 35.0–45.0)
Hemoglobin: 13.3 g/dL (ref 11.7–15.5)
Lymphs Abs: 1575 cells/uL (ref 850–3900)
MCH: 31.1 pg (ref 27.0–33.0)
MCHC: 34.2 g/dL (ref 32.0–36.0)
MCV: 91.1 fL (ref 80.0–100.0)
MPV: 11.5 fL (ref 7.5–12.5)
Monocytes Relative: 9.1 %
Neutro Abs: 2543 cells/uL (ref 1500–7800)
Neutrophils Relative %: 54.1 %
Platelets: 212 10*3/uL (ref 140–400)
RBC: 4.27 10*6/uL (ref 3.80–5.10)
RDW: 12 % (ref 11.0–15.0)
Total Lymphocyte: 33.5 %
WBC: 4.7 10*3/uL (ref 3.8–10.8)

## 2020-09-20 LAB — COMPREHENSIVE METABOLIC PANEL
AG Ratio: 2.4 (calc) (ref 1.0–2.5)
ALT: 12 U/L (ref 6–29)
AST: 13 U/L (ref 10–30)
Albumin: 4.5 g/dL (ref 3.6–5.1)
Alkaline phosphatase (APISO): 47 U/L (ref 31–125)
BUN: 20 mg/dL (ref 7–25)
CO2: 30 mmol/L (ref 20–32)
Calcium: 9.7 mg/dL (ref 8.6–10.2)
Chloride: 101 mmol/L (ref 98–110)
Creat: 0.82 mg/dL (ref 0.50–1.10)
Globulin: 1.9 g/dL (calc) (ref 1.9–3.7)
Glucose, Bld: 82 mg/dL (ref 65–99)
Potassium: 4.5 mmol/L (ref 3.5–5.3)
Sodium: 138 mmol/L (ref 135–146)
Total Bilirubin: 0.5 mg/dL (ref 0.2–1.2)
Total Protein: 6.4 g/dL (ref 6.1–8.1)

## 2020-09-20 LAB — LIPID PANEL
Cholesterol: 177 mg/dL (ref <200)
HDL: 91 mg/dL (ref >=50)
LDL Cholesterol (Calc): 74 mg/dL (calc)
Non-HDL Cholesterol (Calc): 86 mg/dL (calc) (ref <130)
Total CHOL/HDL Ratio: 1.9 (calc) (ref <5.0)
Triglycerides: 50 mg/dL (ref <150)

## 2020-09-20 LAB — FOLLICLE STIMULATING HORMONE: FSH: 142.8 m[IU]/mL — ABNORMAL HIGH

## 2020-12-13 ENCOUNTER — Other Ambulatory Visit: Payer: Self-pay

## 2020-12-13 DIAGNOSIS — E282 Polycystic ovarian syndrome: Secondary | ICD-10-CM

## 2020-12-13 MED ORDER — SPIRONOLACTONE 50 MG PO TABS: 50.0000 mg | ORAL_TABLET | Freq: Every day | ORAL | 1 refills | Status: DC

## 2021-01-03 ENCOUNTER — Other Ambulatory Visit: Payer: Self-pay | Admitting: Nurse Practitioner

## 2021-01-03 DIAGNOSIS — Z1231 Encounter for screening mammogram for malignant neoplasm of breast: Secondary | ICD-10-CM

## 2021-01-16 ENCOUNTER — Encounter: Payer: Self-pay | Admitting: Nurse Practitioner

## 2021-01-29 DIAGNOSIS — D225 Melanocytic nevi of trunk: Secondary | ICD-10-CM | POA: Diagnosis not present

## 2021-01-29 DIAGNOSIS — L821 Other seborrheic keratosis: Secondary | ICD-10-CM | POA: Diagnosis not present

## 2021-01-29 DIAGNOSIS — L578 Other skin changes due to chronic exposure to nonionizing radiation: Secondary | ICD-10-CM | POA: Diagnosis not present

## 2021-01-29 DIAGNOSIS — L814 Other melanin hyperpigmentation: Secondary | ICD-10-CM | POA: Diagnosis not present

## 2021-02-23 ENCOUNTER — Other Ambulatory Visit: Payer: Self-pay

## 2021-02-23 ENCOUNTER — Ambulatory Visit
Admission: RE | Admit: 2021-02-23 | Discharge: 2021-02-23 | Disposition: A | Discharge status: Home / Self Care | Payer: BC Managed Care – PPO | Source: Ambulatory Visit | Attending: Nurse Practitioner | Admitting: Nurse Practitioner

## 2021-02-23 DIAGNOSIS — Z1231 Encounter for screening mammogram for malignant neoplasm of breast: Secondary | ICD-10-CM

## 2021-03-24 ENCOUNTER — Encounter: Payer: Self-pay | Admitting: Nurse Practitioner

## 2021-03-26 ENCOUNTER — Other Ambulatory Visit: Payer: Self-pay

## 2021-03-26 DIAGNOSIS — E282 Polycystic ovarian syndrome: Secondary | ICD-10-CM

## 2021-04-19 ENCOUNTER — Other Ambulatory Visit: Payer: Self-pay | Admitting: Nurse Practitioner

## 2021-04-19 ENCOUNTER — Encounter: Payer: Self-pay | Admitting: Nurse Practitioner

## 2021-04-19 DIAGNOSIS — N950 Postmenopausal bleeding: Secondary | ICD-10-CM

## 2021-04-19 DIAGNOSIS — R7989 Other specified abnormal findings of blood chemistry: Secondary | ICD-10-CM

## 2021-04-19 NOTE — Telephone Encounter (Signed)
She can schedule and the office will check her benefits and let her know,.

## 2021-04-19 NOTE — Telephone Encounter (Signed)
I was going to transfer to appointments to schedule, but I didn't the dx for ultrasound

## 2021-04-19 NOTE — Telephone Encounter (Signed)
Great. I have placed the order for the ultrasound.

## 2021-05-08 ENCOUNTER — Other Ambulatory Visit: Payer: Self-pay | Admitting: Nurse Practitioner

## 2021-05-08 DIAGNOSIS — R7989 Other specified abnormal findings of blood chemistry: Secondary | ICD-10-CM

## 2021-05-08 DIAGNOSIS — N924 Excessive bleeding in the premenopausal period: Secondary | ICD-10-CM

## 2021-05-08 DIAGNOSIS — N950 Postmenopausal bleeding: Secondary | ICD-10-CM

## 2021-05-10 ENCOUNTER — Other Ambulatory Visit: Payer: Self-pay

## 2021-05-10 ENCOUNTER — Encounter: Payer: Self-pay | Admitting: Obstetrics & Gynecology

## 2021-05-10 ENCOUNTER — Ambulatory Visit (INDEPENDENT_AMBULATORY_CARE_PROVIDER_SITE_OTHER): Payer: BC Managed Care – PPO

## 2021-05-10 ENCOUNTER — Ambulatory Visit (INDEPENDENT_AMBULATORY_CARE_PROVIDER_SITE_OTHER): Payer: BC Managed Care – PPO | Admitting: Obstetrics & Gynecology

## 2021-05-10 VITALS — BP 110/72 | HR 55 | Resp 14

## 2021-05-10 DIAGNOSIS — N950 Postmenopausal bleeding: Secondary | ICD-10-CM | POA: Diagnosis not present

## 2021-05-10 DIAGNOSIS — R7989 Other specified abnormal findings of blood chemistry: Secondary | ICD-10-CM

## 2021-05-10 DIAGNOSIS — Z30011 Encounter for initial prescription of contraceptive pills: Secondary | ICD-10-CM

## 2021-05-10 DIAGNOSIS — N926 Irregular menstruation, unspecified: Secondary | ICD-10-CM | POA: Diagnosis not present

## 2021-05-10 MED ORDER — NORETHINDRONE 0.35 MG PO TABS: 1.0000 | ORAL_TABLET | Freq: Every day | ORAL | 1 refills | Status: DC

## 2021-05-10 NOTE — Progress Notes (Signed)
    Hannah Flores May 12, 1976 [id number]        45 y.o.  G1P1001   RP: Irregular bleeding for Pelvic US  HPI: Light to moderate menses every 3-4 months.  [facility] 09/2020 was high at 142.8.  Mild intermittent hot flushes.  No pelvic pain.  Not currently using contraception.  Starting a menstrual period today.  No recent IC.   OB History  Gravida Para Term Preterm AB Living  1 1 1     1   SAB IAB Ectopic Multiple Live Births          1    # Outcome Date GA Lbr Len/2nd Weight Sex Delivery Anes PTL Lv  1 Term 11/29/12 [redacted]w[redacted]d  7 lb 8.1 oz (3.405 kg) M CS-LVertical Spinal  LIV    Past medical history,surgical history, problem list, medications, allergies, family history and social history were all reviewed and documented in the EPIC chart.   Directed ROS with pertinent positives and negatives documented in the history of present illness/assessment and plan.  Exam:  Vitals:   05/10/21 0858  BP: 110/72  Pulse: (!) 55  Resp: 14   General appearance:  Normal  Pelvic 05/12/21 today: T/V images.  Anteverted uterus normal in size and shape with no myometrial mass.  The uterus is measured at 7.56 x 5.38 x 4.62 cm.  Thin symmetrical endometrium on day 1 of cycle with no mass or thickening seen.  The endometrial lining is measured at 6.12 mm.  Both ovaries are normal in size with a left ovarian cyst cyst which is avascular and echo-free as well as thin-walled measured at 3.5 x 2.6 cm.  No adnexal mass otherwise.  No free fluid in the posterior cul-de-sac.   Assessment/Plan:  45 y.o. G1P1001   1. Irregular menstrual cycle Oligomenorrhea, probably perimenopausal.  Will recheck [facility] today.  Pelvic [facility] findings thoroughly reviewed with patient.  Normal uterus and endometrial line at 6.12 mm.  Ovarian follicles present.  Patient reassured. - [facility]  2. Encounter for initial prescription of contraceptive pills Probably perimenopausal with oligomenorrhea.  Needs contraception.  Decision to start on the Progestin  pill now.  No CI.  Usage reviewed and prescription sent to the pharmacy.  Will probably f/u for Mirena IUD insertion for longer term contraception and endometrial protection with Progesterone. - IUD Insertion; Future  Other orders - norethindrone (MICRONOR) 0.35 MG tablet; Take 1 tablet (0.35 mg total) by mouth daily.   [place] MD, 9:11 AM 05/10/2021

## 2021-05-11 LAB — FOLLICLE STIMULATING HORMONE: FSH: 30.9 m[IU]/mL

## 2021-05-15 ENCOUNTER — Other Ambulatory Visit: Payer: BC Managed Care – PPO | Admitting: Obstetrics and Gynecology

## 2021-05-15 ENCOUNTER — Other Ambulatory Visit: Payer: BC Managed Care – PPO

## 2021-05-30 ENCOUNTER — Other Ambulatory Visit: Payer: Self-pay | Admitting: Obstetrics & Gynecology

## 2021-05-30 NOTE — Telephone Encounter (Signed)
IUD insertion scheduled on 06/15/21

## 2021-06-07 ENCOUNTER — Other Ambulatory Visit: Payer: BC Managed Care – PPO

## 2021-06-07 ENCOUNTER — Other Ambulatory Visit: Payer: BC Managed Care – PPO | Admitting: Obstetrics & Gynecology

## 2021-06-15 ENCOUNTER — Ambulatory Visit (INDEPENDENT_AMBULATORY_CARE_PROVIDER_SITE_OTHER): Payer: BC Managed Care – PPO | Admitting: Obstetrics & Gynecology

## 2021-06-15 ENCOUNTER — Encounter: Payer: Self-pay | Admitting: Obstetrics & Gynecology

## 2021-06-15 ENCOUNTER — Other Ambulatory Visit: Payer: Self-pay

## 2021-06-15 VITALS — BP 110/70 | HR 80 | Resp 14 | Ht 64.0 in | Wt 128.0 lb

## 2021-06-15 DIAGNOSIS — Z538 Procedure and treatment not carried out for other reasons: Secondary | ICD-10-CM | POA: Diagnosis not present

## 2021-06-15 DIAGNOSIS — N882 Stricture and stenosis of cervix uteri: Secondary | ICD-10-CM

## 2021-06-15 DIAGNOSIS — Z3041 Encounter for surveillance of contraceptive pills: Secondary | ICD-10-CM | POA: Diagnosis not present

## 2021-06-15 DIAGNOSIS — E282 Polycystic ovarian syndrome: Secondary | ICD-10-CM

## 2021-06-15 MED ORDER — SPIRONOLACTONE 50 MG PO TABS: 50.0000 mg | ORAL_TABLET | Freq: Every day | ORAL | 3 refills | Status: DC

## 2021-06-15 MED ORDER — NORETHINDRONE 0.35 MG PO TABS: 1.0000 | ORAL_TABLET | Freq: Every day | ORAL | 4 refills | Status: DC

## 2021-06-15 NOTE — Progress Notes (Signed)
    Hannah Flores 05-31-1976 [id number]        45 y.o.  G1P1001   RP: Mirena IUD insertion  HPI: Well on the Progestin pill, but would like to try the Mirena IUD.  Oligomenorrhea, perimenopausal.  FSH in 05/10/2021 at 30.9.     OB History  Gravida Para Term Preterm AB Living  _0 SAB IAB Ectopic Multiple Live Births          1    # Outcome Date GA Lbr Len/2nd Weight Sex Delivery Anes PTL Lv  1 Term 11/29/12 [redacted]w[redacted]d 7 lb 8.1 oz (3.405 kg) M CS-LVertical Spinal  LIV    Past medical history,surgical history, problem list, medications, allergies, family history and social history were all reviewed and documented in the EPIC chart.   Directed ROS with pertinent positives and negatives documented in the history of present illness/assessment and plan.  Exam:  Vitals:   06/15/21 1459  BP: 110/70  Pulse: 80  Resp: 14  Weight: 128 lb (58.1 kg)  Height: _1  (1.626 m)   General appearance:  Normal                                                                    IUD procedure note   Patient presented to the office today for placement of Mirena IUD. The patient had previously been provided with literature information on this method of contraception. The risks benefits and pros and cons were discussed and all her questions were answered. She is fully aware that this form of contraception is 99% effective and is good for 7 years.  Informed consent signed.  Pelvic exam: Vulva normal Vagina: No lesions or discharge Cervix: No lesions or discharge.  Tenaculum placed on anterior lip of cervix.  Betadine prep.  Hurricane spray.  Cervix is severely stenotic.  Attempted dilation with Os finder, then with the small metal dilator kit.  Smallest dilator not going through.  Decision to stop at that point at patient's preference. Per recent Pelvic [place] Uterus: AV position, normal size Adnexa: No masses or tenderness Rectal exam: Not done   Assessment/Plan:  45y.o. G1P1001   1.  Unsuccessful attempt to insert intrauterine device (IUD) Patient was doing well on the progestin only pill, but wanted to try the Mirena IUD.  Given the stenotic cervix, the insertion of Mirena IUD was unsuccessful.  In those circumstances, patient opts for continuing on the progestin only pill.  Prefers not to have estradiol in the pill.  No contraindication.  Prescription sent to pharmacy.  2. Cervical stenosis (uterine cervix) Cervical stenosis not overcome with the smallest metal cervical dilator.  3. PCOS (polycystic ovarian syndrome) - spironolactone (ALDACTONE) 50 MG tablet; Take 1 tablet (50 mg total) by mouth daily.  Other orders - norethindrone (MICRONOR) 0.35 MG tablet; Take 1 tablet (0.35 mg total) by mouth daily.   MPrincess BruinsMD, 3:24 PM 06/15/2021

## 2021-06-27 ENCOUNTER — Encounter: Payer: Self-pay | Admitting: Obstetrics & Gynecology

## 2021-08-19 IMAGING — MG MM DIGITAL SCREENING BILAT W/ TOMO AND CAD
8 series · 9 of 24 positions shown · non-contrast
Comparison: Previous exam(s).

CLINICAL DATA: Screening.

EXAM:
DIGITAL SCREENING BILATERAL MAMMOGRAM WITH TOMOSYNTHESIS AND CAD
TECHNIQUE: Bilateral screening digital craniocaudal and mediolateral oblique
mammograms were obtained. Bilateral screening digital breast
tomosynthesis was performed. The images were evaluated with
computer-aided detection.

[R CC synth-2D]
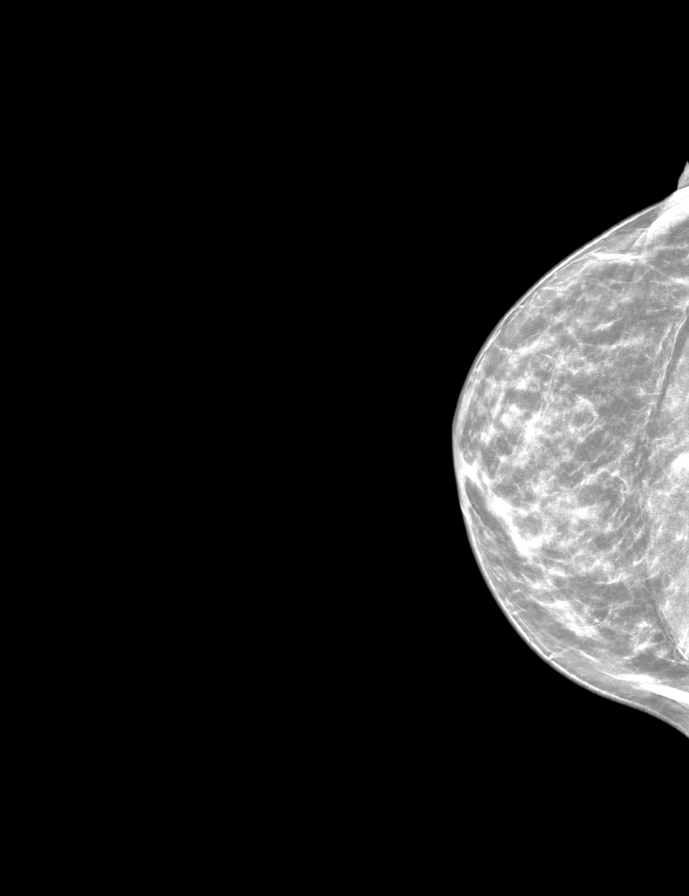

[L MLO synth-2D]
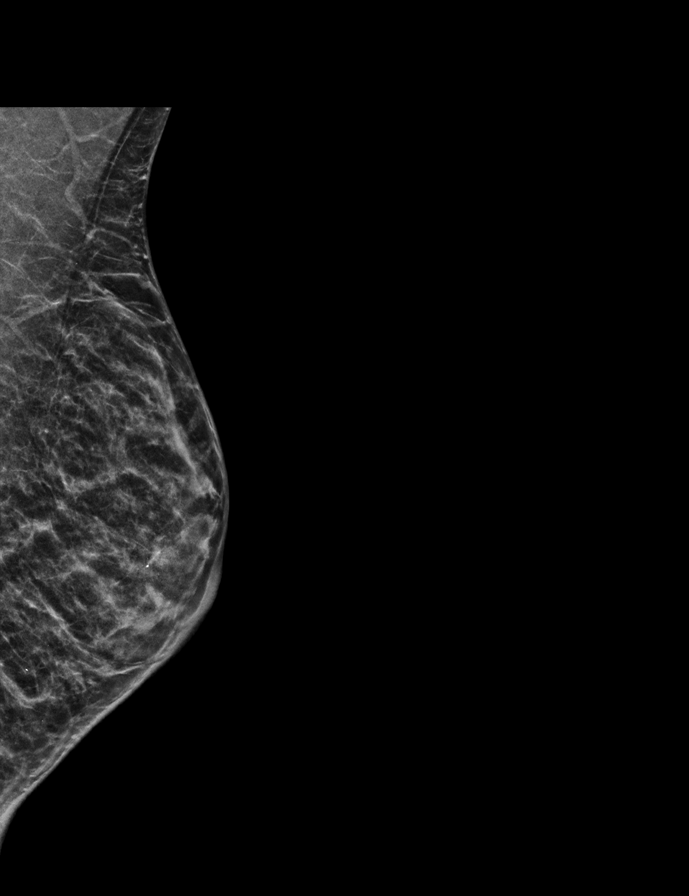

[R MLO synth-2D]
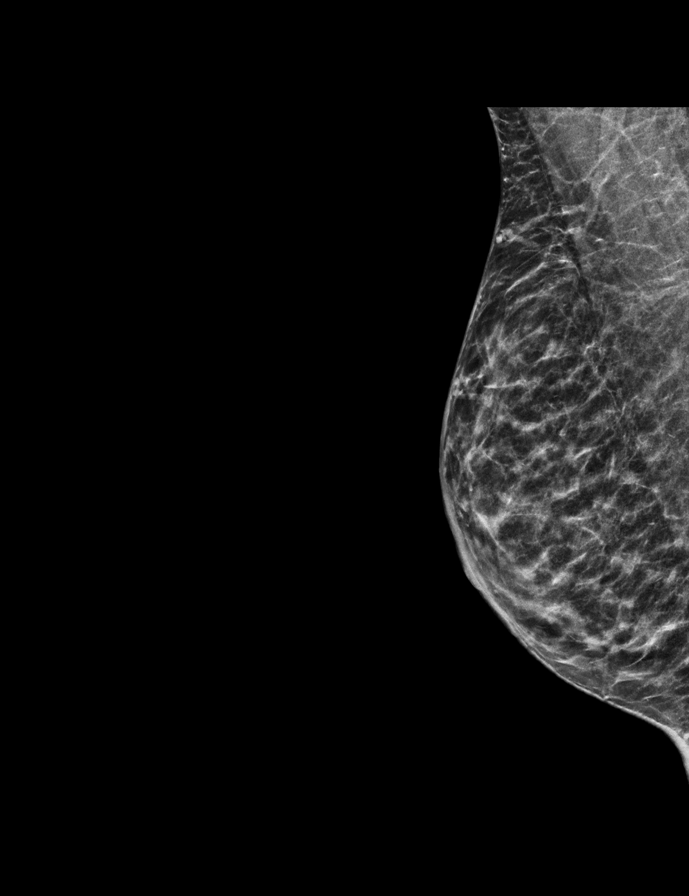

[L CC synth-2D]
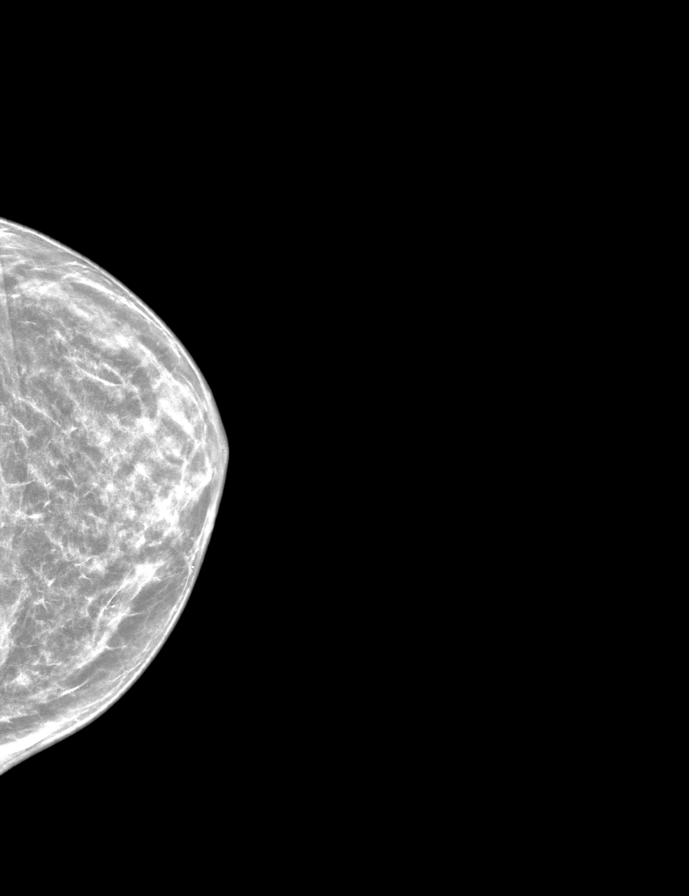

[L MLO tomo · 2 of 43 frames shown]
[frame 14/43]
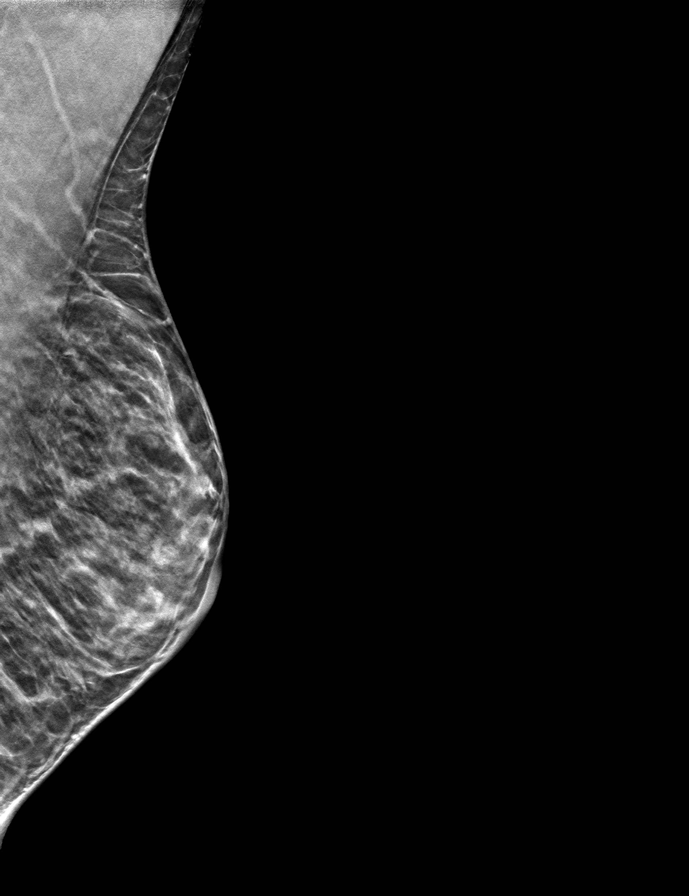
[frame 22/43]
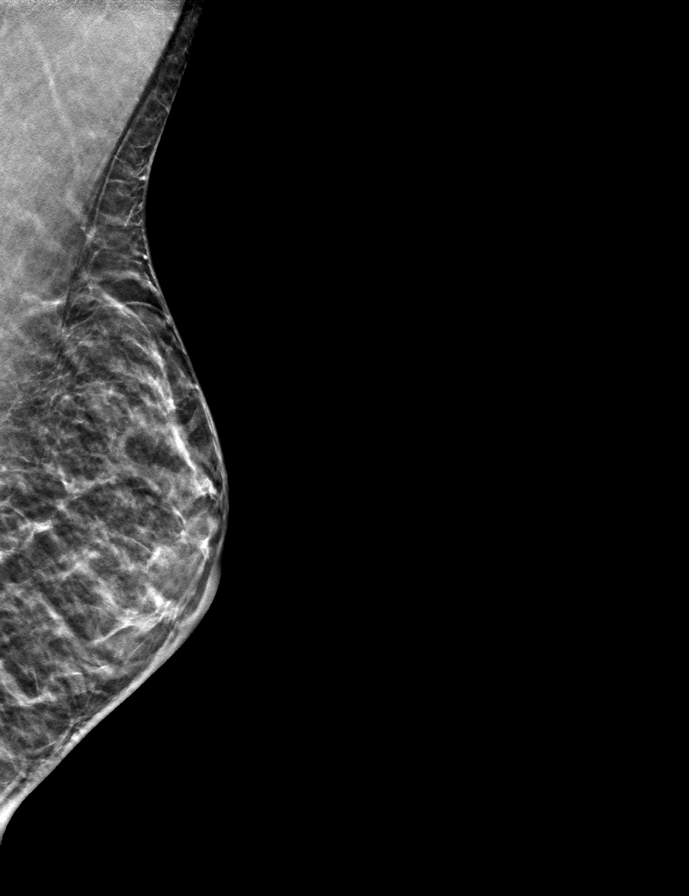

[L CC tomo · tomo slice 23/45.0]
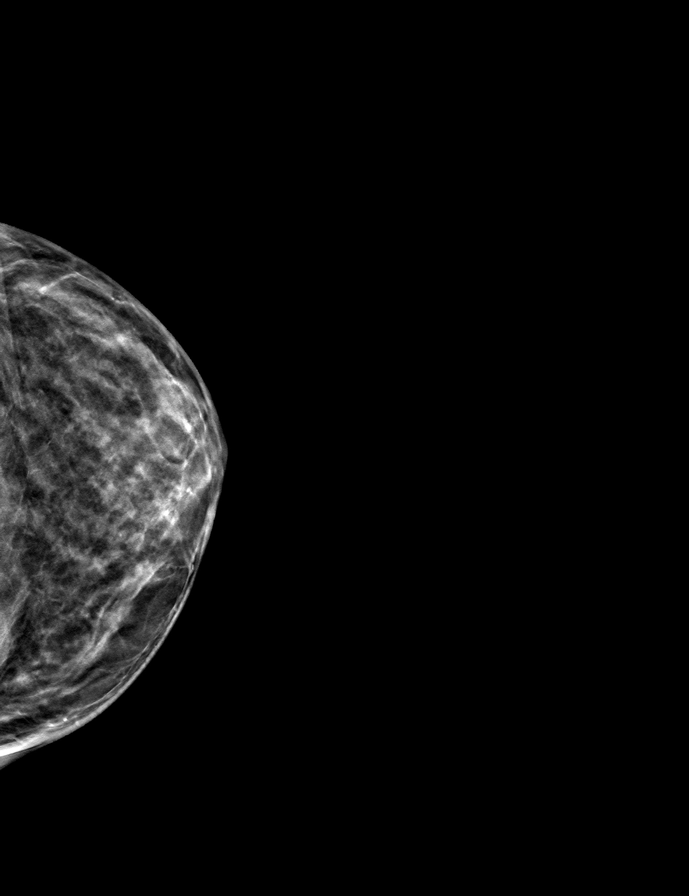

[R MLO tomo · tomo slice 23/46.0]
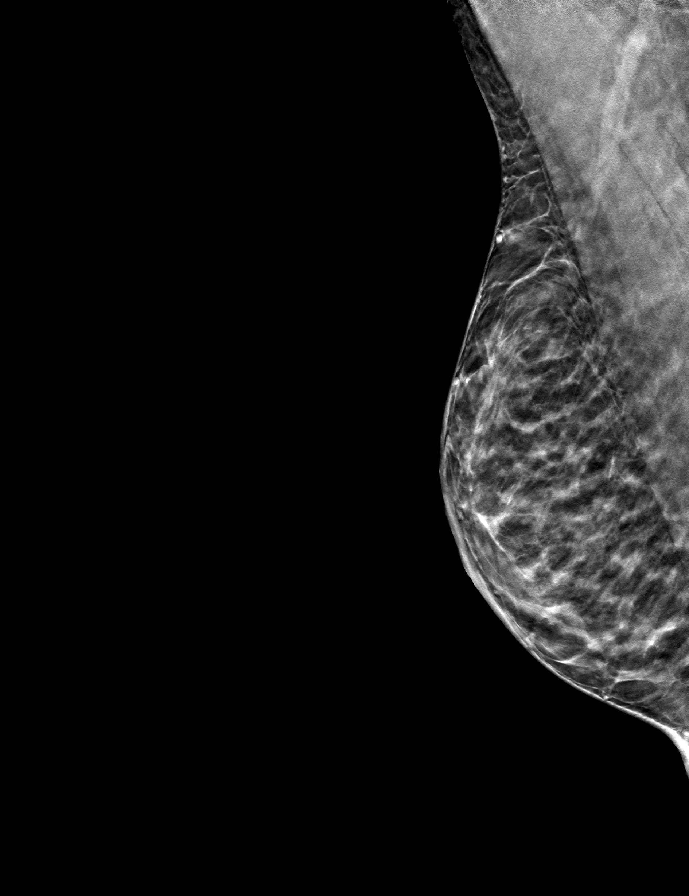

[R CC tomo · tomo slice 26/51.0]
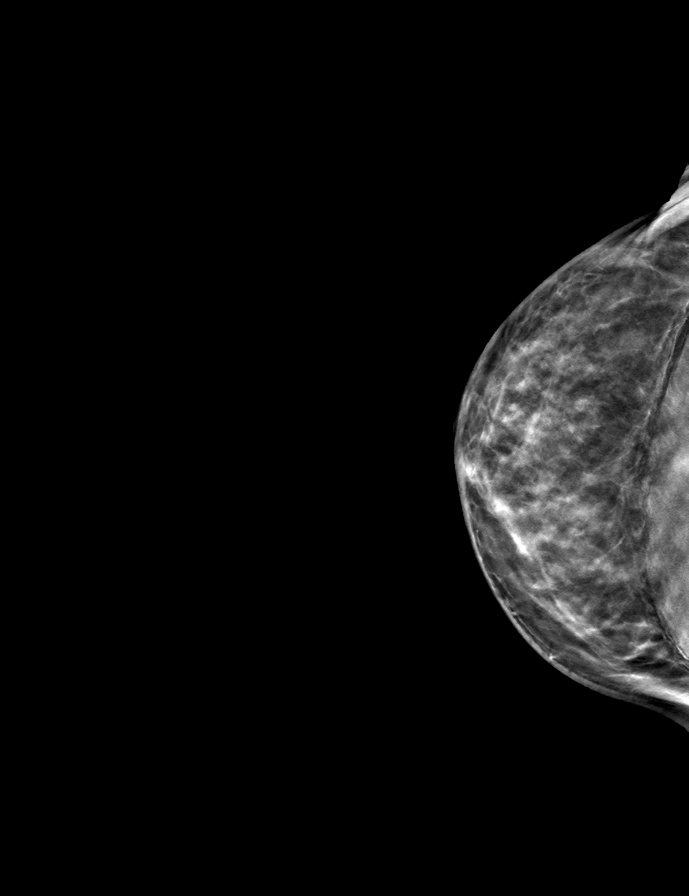

[9 of 24 positions shown; findings below may reference images not displayed]

ACR Breast Density Category c: The breast tissue is heterogeneously
dense, which may obscure small masses.
FINDINGS: There are no findings suspicious for malignancy. The images were
evaluated with computer-aided detection.
IMPRESSION: No mammographic evidence of malignancy. A result letter of this
screening mammogram will be mailed directly to the patient.

RECOMMENDATION:
Screening mammogram in one year. (Code:T4-5-GWO)

BI-RADS CATEGORY  1: Negative.

## 2021-09-20 ENCOUNTER — Ambulatory Visit: Payer: Self-pay | Admitting: Nurse Practitioner

## 2021-10-03 ENCOUNTER — Other Ambulatory Visit (HOSPITAL_COMMUNITY)
Admission: RE | Admit: 2021-10-03 | Discharge: 2021-10-03 | Disposition: A | Discharge status: Home / Self Care | Payer: BC Managed Care – PPO | Source: Ambulatory Visit | Attending: Nurse Practitioner | Admitting: Nurse Practitioner

## 2021-10-03 ENCOUNTER — Other Ambulatory Visit: Payer: Self-pay

## 2021-10-03 ENCOUNTER — Encounter: Payer: Self-pay | Admitting: Obstetrics & Gynecology

## 2021-10-03 ENCOUNTER — Ambulatory Visit (INDEPENDENT_AMBULATORY_CARE_PROVIDER_SITE_OTHER): Payer: BC Managed Care – PPO | Admitting: Obstetrics & Gynecology

## 2021-10-03 VITALS — BP 118/74 | Ht 63.5 in | Wt 127.0 lb

## 2021-10-03 DIAGNOSIS — N879 Dysplasia of cervix uteri, unspecified: Secondary | ICD-10-CM

## 2021-10-03 DIAGNOSIS — Z3041 Encounter for surveillance of contraceptive pills: Secondary | ICD-10-CM

## 2021-10-03 DIAGNOSIS — Z01419 Encounter for gynecological examination (general) (routine) without abnormal findings: Secondary | ICD-10-CM | POA: Diagnosis not present

## 2021-10-03 DIAGNOSIS — E282 Polycystic ovarian syndrome: Secondary | ICD-10-CM | POA: Diagnosis not present

## 2021-10-03 DIAGNOSIS — F418 Other specified anxiety disorders: Secondary | ICD-10-CM | POA: Diagnosis not present

## 2021-10-03 MED ORDER — SPIRONOLACTONE 50 MG PO TABS: 50.0000 mg | ORAL_TABLET | Freq: Every day | ORAL | 4 refills | Status: DC

## 2021-10-03 MED ORDER — NORETHINDRONE 0.35 MG PO TABS: 1.0000 | ORAL_TABLET | Freq: Every day | ORAL | 4 refills | Status: DC

## 2021-10-03 MED ORDER — SERTRALINE HCL 50 MG PO TABS: 75.0000 mg | ORAL_TABLET | Freq: Every day | ORAL | 4 refills | Status: DC

## 2021-10-03 NOTE — Progress Notes (Signed)
Hannah Flores 22-Feb-1976 [id number]   History:    45 y.o. G34P1L1 Married  RP:  Established patient presenting for annual gyn exam   HPI: Well on the Progestin pill. No BTB.  No pelvic pain.  No pain with IC.  Last Pap 09/2019 Neg.  H/O LEEP.  Breasts normal.  Screening mammo 02/2021 Neg.  Urine/BMs normal.  BMI 22.14.  Health labs Normal 09/2020.  Will repeat next year.  Recommend scheduling a Colonoscopy.  Past medical history,surgical history, family history and social history were all reviewed and documented in the EPIC chart.  Gynecologic History Patient's last menstrual period was 07/25/2021.  Obstetric History OB History  Gravida Para Term Preterm AB Living  1 1 1     1   SAB IAB Ectopic Multiple Live Births          1    # Outcome Date GA Lbr Len/2nd Weight Sex Delivery Anes PTL Lv  1 Term 11/29/12 [redacted]w[redacted]d  7 lb 8.1 oz (3.405 kg) M CS-LVertical Spinal  LIV     ROS: A ROS was performed and pertinent positives and negatives are included in the history.  GENERAL: No fevers or chills. HEENT: No change in vision, no earache, sore throat or sinus congestion. NECK: No pain or stiffness. CARDIOVASCULAR: No chest pain or pressure. No palpitations. PULMONARY: No shortness of breath, cough or wheeze. GASTROINTESTINAL: No abdominal pain, nausea, vomiting or diarrhea, melena or bright red blood per rectum. GENITOURINARY: No urinary frequency, urgency, hesitancy or dysuria. MUSCULOSKELETAL: No joint or muscle pain, no back pain, no recent trauma. DERMATOLOGIC: No rash, no itching, no lesions. ENDOCRINE: No polyuria, polydipsia, no heat or cold intolerance. No recent change in weight. HEMATOLOGICAL: No anemia or easy bruising or bleeding. NEUROLOGIC: No headache, seizures, numbness, tingling or weakness. PSYCHIATRIC: No depression, no loss of interest in normal activity or change in sleep pattern.     Exam:   BP 118/74   Ht 5' 3.5" (1.613 m)   Wt 127 lb (57.6 kg)   LMP 07/25/2021   BMI  22.14 kg/m   Body mass index is 22.14 kg/m.  General appearance : Well developed well nourished female. No acute distress HEENT: Eyes: no retinal hemorrhage or exudates,  Neck supple, trachea midline, no carotid bruits, no thyroidmegaly Lungs: Clear to auscultation, no rhonchi or wheezes, or rib retractions  Heart: Regular rate and rhythm, no murmurs or gallops Breast:Examined in sitting and supine position were symmetrical in appearance, no palpable masses or tenderness,  no skin retraction, no nipple inversion, no nipple discharge, no skin discoloration, no axillary or supraclavicular lymphadenopathy Abdomen: no palpable masses or tenderness, no rebound or guarding Extremities: no edema or skin discoloration or tenderness  Pelvic: Vulva: Normal             Vagina: No gross lesions or discharge  Cervix: No gross lesions or discharge. Pap reflex done.  Uterus  AV, normal size, shape and consistency, non-tender and mobile  Adnexa  Without masses or tenderness  Anus: Normal   Assessment/Plan:  45 y.o. female for annual exam   1. Encounter for routine gynecological examination with Papanicolaou smear of cervix Well on the Progestin pill. No BTB.  No pelvic pain.  No pain with IC.  Last Pap 09/2019 Neg.  H/O LEEP.  Breasts normal.  Screening mammo 02/2021 Neg.  Urine/BMs normal.  BMI 22.14.  Health labs Normal 09/2020.  Will repeat next year.  Recommend scheduling a Colonoscopy. - Cytology -  PAP( [place])  2. Cervical dysplasia - Cytology - PAP( [place])  3. Encounter for surveillance of contraceptive pills Well on the Progestin only pill.  Good compliance.  No BTB.  Prescription sent to pharmacy.  4. PCOS (polycystic ovarian syndrome) Controlling hormonal acne.  No CI to continue.  Prescription sent to pharmacy. - spironolactone (ALDACTONE) 50 MG tablet; Take 1 tablet (50 mg total) by mouth daily.  5. Anxiety with depression Stable on Sertraline 75 mg daily.  Prescription  sent to pharmacy. - sertraline (ZOLOFT) 50 MG tablet; Take 1.5 tablets (75 mg total) by mouth daily.  Other orders - norethindrone (MICRONOR) 0.35 MG tablet; Take 1 tablet (0.35 mg total) by mouth daily.   Genia Del MD, 8:49 AM 10/03/2021

## 2021-10-10 LAB — CYTOLOGY - PAP: Diagnosis: NEGATIVE

## 2022-10-10 ENCOUNTER — Ambulatory Visit (INDEPENDENT_AMBULATORY_CARE_PROVIDER_SITE_OTHER): Payer: BC Managed Care – PPO | Admitting: Obstetrics & Gynecology

## 2022-10-10 ENCOUNTER — Other Ambulatory Visit (HOSPITAL_COMMUNITY)
Admission: RE | Admit: 2022-10-10 | Discharge: 2022-10-10 | Disposition: A | Discharge status: Home / Self Care | Payer: BC Managed Care – PPO | Source: Ambulatory Visit | Attending: Obstetrics & Gynecology | Admitting: Obstetrics & Gynecology

## 2022-10-10 ENCOUNTER — Other Ambulatory Visit: Payer: Self-pay | Admitting: Obstetrics & Gynecology

## 2022-10-10 ENCOUNTER — Encounter: Payer: Self-pay | Admitting: Obstetrics & Gynecology

## 2022-10-10 VITALS — BP 114/74 | HR 62 | Ht 63.5 in | Wt 127.0 lb

## 2022-10-10 DIAGNOSIS — L578 Other skin changes due to chronic exposure to nonionizing radiation: Secondary | ICD-10-CM | POA: Diagnosis not present

## 2022-10-10 DIAGNOSIS — Z01419 Encounter for gynecological examination (general) (routine) without abnormal findings: Secondary | ICD-10-CM | POA: Diagnosis not present

## 2022-10-10 DIAGNOSIS — L700 Acne vulgaris: Secondary | ICD-10-CM

## 2022-10-10 DIAGNOSIS — Z1231 Encounter for screening mammogram for malignant neoplasm of breast: Secondary | ICD-10-CM

## 2022-10-10 DIAGNOSIS — Z9889 Other specified postprocedural states: Secondary | ICD-10-CM | POA: Insufficient documentation

## 2022-10-10 DIAGNOSIS — L821 Other seborrheic keratosis: Secondary | ICD-10-CM | POA: Diagnosis not present

## 2022-10-10 DIAGNOSIS — L814 Other melanin hyperpigmentation: Secondary | ICD-10-CM | POA: Diagnosis not present

## 2022-10-10 DIAGNOSIS — D225 Melanocytic nevi of trunk: Secondary | ICD-10-CM | POA: Diagnosis not present

## 2022-10-10 DIAGNOSIS — Z3041 Encounter for surveillance of contraceptive pills: Secondary | ICD-10-CM

## 2022-10-10 MED ORDER — DROSPIRENONE-ETHINYL ESTRADIOL 3-0.02 MG PO TABS: 1.0000 | ORAL_TABLET | Freq: Every day | ORAL | 4 refills | Status: DC

## 2022-10-10 NOTE — Progress Notes (Signed)
Hannah Flores October 24, 1976 [id number]   History:    46 y.o. G1P1L1 Married.  Son is 42 yo.   RP:  Established patient presenting for annual gyn exam    HPI: Well on the Progestin pill. No BTB.  No pelvic pain.  No pain with IC.  Scheduled with Dermato for acne and rashes.  Will switch to Yaz generic for acne.  Last Pap 09/2021 Neg.  H/O LEEP.  Pap reflex today. Breasts normal.  Screening mammo 02/2021 Neg.  Will schedule a screening Mammo today. Urine/BMs normal.  BMI 22.14.  Health labs Normal 09/2020.  Recommend scheduling a Colonoscopy.   Past medical history,surgical history, family history and social history were all reviewed and documented in the EPIC chart.  Gynecologic History No LMP recorded. (Menstrual status: Perimenopausal).  Obstetric History OB History  Gravida Para Term Preterm AB Living  1 1 1     1   SAB IAB Ectopic Multiple Live Births          1    # Outcome Date GA Lbr Len/2nd Weight Sex Delivery Anes PTL Lv  1 Term 11/29/12 [redacted]w[redacted]d  7 lb 8.1 oz (3.405 kg) M CS-LVertical Spinal  LIV     ROS: A ROS was performed and pertinent positives and negatives are included in the history. GENERAL: No fevers or chills. HEENT: No change in vision, no earache, sore throat or sinus congestion. NECK: No pain or stiffness. CARDIOVASCULAR: No chest pain or pressure. No palpitations. PULMONARY: No shortness of breath, cough or wheeze. GASTROINTESTINAL: No abdominal pain, nausea, vomiting or diarrhea, melena or bright red blood per rectum. GENITOURINARY: No urinary frequency, urgency, hesitancy or dysuria. MUSCULOSKELETAL: No joint or muscle pain, no back pain, no recent trauma. DERMATOLOGIC: No rash, no itching, no lesions. ENDOCRINE: No polyuria, polydipsia, no heat or cold intolerance. No recent change in weight. HEMATOLOGICAL: No anemia or easy bruising or bleeding. NEUROLOGIC: No headache, seizures, numbness, tingling or weakness. PSYCHIATRIC: No depression, no loss of interest in normal  activity or change in sleep pattern.     Exam:   BP 114/74   Pulse 62   Ht 5' 3.5" (1.613 m)   Wt 127 lb (57.6 kg)   SpO2 99%   BMI 22.14 kg/m   Body mass index is 22.14 kg/m.  General appearance : Well developed well nourished female. No acute distress HEENT: Eyes: no retinal hemorrhage or exudates,  Neck supple, trachea midline, no carotid bruits, no thyroidmegaly Lungs: Clear to auscultation, no rhonchi or wheezes, or rib retractions  Heart: Regular rate and rhythm, no murmurs or gallops Breast:Examined in sitting and supine position were symmetrical in appearance, no palpable masses or tenderness,  no skin retraction, no nipple inversion, no nipple discharge, no skin discoloration, no axillary or supraclavicular lymphadenopathy Abdomen: no palpable masses or tenderness, no rebound or guarding Extremities: no edema or skin discoloration or tenderness  Pelvic: Vulva: Normal             Vagina: No gross lesions or discharge  Cervix: No gross lesions or discharge.  Pap reflex done.  Uterus  AV, normal size, shape and consistency, non-tender and mobile  Adnexa  Without masses or tenderness  Anus: Normal   Assessment/Plan:  46 y.o. female for annual exam   1. Encounter for routine gynecological examination with Papanicolaou smear of cervix Well on the Progestin pill. No BTB.  No pelvic pain.  No pain with IC.  Scheduled with Dermato for acne and rashes.  Will switch to Yaz generic for acne.  Last Pap 09/2021 Neg.  H/O LEEP.  Pap reflex today. Breasts normal.  Screening mammo 02/2021 Neg.  Will schedule a screening Mammo today. Urine/BMs normal.  BMI 22.14.  Health labs Normal 09/2020.  Recommend scheduling a Colonoscopy. - Cytology - PAP( [place])  2. H/O LEEP - Cytology - PAP( [place])  3. Encounter for surveillance of contraceptive pills Given acne, decision to stop the Progestin only pill and switch to the generic of Yaz.  Risks/benefits and usage reviewed,  prescription sent to pharmacy.  4. Acne vulgaris Acne and new onset rashes, scheduled with Dermato.  Other orders - drospirenone-ethinyl estradiol (YAZ) 3-0.02 MG tablet; Take 1 tablet by mouth daily.   Genia Del MD, 9:05 AM

## 2022-10-11 ENCOUNTER — Other Ambulatory Visit: Payer: Self-pay | Admitting: Obstetrics & Gynecology

## 2022-10-11 DIAGNOSIS — F418 Other specified anxiety disorders: Secondary | ICD-10-CM

## 2022-10-11 LAB — CYTOLOGY - PAP
Adequacy: ABSENT
Diagnosis: NEGATIVE

## 2022-10-11 NOTE — Telephone Encounter (Signed)
Patient called back stating she would like to continue Rx.

## 2022-10-11 NOTE — Telephone Encounter (Signed)
Last AEX 10/10/2022--scheduled for 10/16/2023.  Did not see any notes in recent exam visit where this medication was discussed and/or if this was something that she wished to continue on,  Called pt to confirm, no answer, left detailed msg per DPR for her to call us back to confirm.

## 2022-10-14 ENCOUNTER — Encounter: Payer: Self-pay | Admitting: *Deleted

## 2022-11-17 ENCOUNTER — Other Ambulatory Visit: Payer: Self-pay | Admitting: Obstetrics & Gynecology

## 2022-11-17 DIAGNOSIS — E282 Polycystic ovarian syndrome: Secondary | ICD-10-CM

## 2022-11-18 NOTE — Telephone Encounter (Signed)
Last AEX 10/10/2022--scheduled for 10/16/2023. Last mammo-02/23/2021-neg birads 1, scheduled for 12/04/2022.   Per AEX notes from 10/10/2022--change from POPs to OCPs for acne--also recommended to see derm. Will resfuse scripts for now.

## 2022-11-23 ENCOUNTER — Other Ambulatory Visit: Payer: Self-pay | Admitting: Obstetrics & Gynecology

## 2022-11-23 DIAGNOSIS — E282 Polycystic ovarian syndrome: Secondary | ICD-10-CM

## 2022-11-26 NOTE — Telephone Encounter (Signed)
Med refill request:spironolactone 50 mg tab PO daily Last AEX: 10/10/22/ ML Next AEX: 10/16/23/ ML Last MMG (if hormonal med) N/A  Rx last sent 10/03/21 -#90/4RF  Refill authorized: Please Advise?

## 2022-12-04 ENCOUNTER — Ambulatory Visit
Admission: RE | Admit: 2022-12-04 | Discharge: 2022-12-04 | Disposition: A | Discharge status: Home / Self Care | Payer: BC Managed Care – PPO | Source: Ambulatory Visit | Attending: Obstetrics & Gynecology | Admitting: Obstetrics & Gynecology

## 2022-12-04 DIAGNOSIS — Z1231 Encounter for screening mammogram for malignant neoplasm of breast: Secondary | ICD-10-CM

## 2023-06-25 DIAGNOSIS — M25562 Pain in left knee: Secondary | ICD-10-CM | POA: Diagnosis not present

## 2023-06-25 DIAGNOSIS — M25561 Pain in right knee: Secondary | ICD-10-CM | POA: Diagnosis not present

## 2023-10-14 ENCOUNTER — Other Ambulatory Visit: Payer: Self-pay | Admitting: Obstetrics & Gynecology

## 2023-10-14 DIAGNOSIS — F418 Other specified anxiety disorders: Secondary | ICD-10-CM

## 2023-10-14 NOTE — Telephone Encounter (Signed)
Medication refill request: Zoloft  Last AEX:  10/10/22 Next AEX: 10/16/23 Last MMG (if hormonal medication request): n/a Refill authorized: #30 with 0 rf

## 2023-10-16 ENCOUNTER — Ambulatory Visit (INDEPENDENT_AMBULATORY_CARE_PROVIDER_SITE_OTHER): Payer: BC Managed Care – PPO | Admitting: Obstetrics and Gynecology

## 2023-10-16 ENCOUNTER — Ambulatory Visit: Payer: BC Managed Care – PPO | Admitting: Obstetrics & Gynecology

## 2023-10-16 ENCOUNTER — Encounter: Payer: Self-pay | Admitting: Obstetrics and Gynecology

## 2023-10-16 VITALS — BP 102/70 | HR 67 | Ht 63.5 in | Wt 122.0 lb

## 2023-10-16 DIAGNOSIS — Z3041 Encounter for surveillance of contraceptive pills: Secondary | ICD-10-CM | POA: Diagnosis not present

## 2023-10-16 DIAGNOSIS — E559 Vitamin D deficiency, unspecified: Secondary | ICD-10-CM

## 2023-10-16 DIAGNOSIS — E785 Hyperlipidemia, unspecified: Secondary | ICD-10-CM | POA: Diagnosis not present

## 2023-10-16 DIAGNOSIS — Z01419 Encounter for gynecological examination (general) (routine) without abnormal findings: Secondary | ICD-10-CM | POA: Insufficient documentation

## 2023-10-16 DIAGNOSIS — Z1211 Encounter for screening for malignant neoplasm of colon: Secondary | ICD-10-CM

## 2023-10-16 DIAGNOSIS — Z3009 Encounter for other general counseling and advice on contraception: Secondary | ICD-10-CM | POA: Insufficient documentation

## 2023-10-16 DIAGNOSIS — E7889 Other lipoprotein metabolism disorders: Secondary | ICD-10-CM

## 2023-10-16 MED ORDER — DROSPIRENONE-ETHINYL ESTRADIOL 3-0.02 MG PO TABS: 1.0000 | ORAL_TABLET | Freq: Every day | ORAL | 4 refills | Status: AC

## 2023-10-16 NOTE — Progress Notes (Addendum)
47 y.o. G102P1001 female here for annual exam. Married. 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 Frustrated about this but overall doing okay.  Patient's last menstrual period was 10/13/2023 (approximate). Period Duration (Days): 2-3 Period Pattern: (!) Irregular Menstrual Flow: Light Menstrual Control: Panty liner, Tampon Dysmenorrhea: None  Abnormal bleeding: minimal with COC Pelvic discharge or pain: none Breast mass, nipple discharge or skin changes : none Birth control: COC Last PAP:     Component Value Date/Time   DIAGPAP  10/10/2022 0910    - Negative for intraepithelial lesion or malignancy (NILM)   DIAGPAP  10/03/2021 0908    - Negative for intraepithelial lesion or malignancy (NILM)   ADEQPAP  10/10/2022 0910    Satisfactory for evaluation; transformation zone component ABSENT.   ADEQPAP  10/03/2021 0908    Satisfactory for evaluation; transformation zone component PRESENT.   Last mammogram: 12/04/2022 BIRADS 1, density c Last colonoscopy: never Sexually active: yes  Exercising: Yes. Barre instructor   GYN HISTORY: Hx of CIN 3 (2002), s/p ablation  OB History  Gravida Para Term Preterm AB Living  1 1 1     1   SAB IAB Ectopic Multiple Live Births          1    # Outcome Date GA Lbr Len/2nd Weight Sex Type Anes PTL Lv  1 Term 11/29/12 [redacted]w[redacted]d  7 lb 8.1 oz (3.405 kg) M CS-LVertical Spinal  LIV    Past Medical History:  Diagnosis Date   Acne    Anxiety    CIN I (cervical intraepithelial neoplasia I) 06/08/09   CIN I AND HPV EFFECT ON PATH FROM BIOPSY. S/P CRYOSURGERY   CIN II (cervical intraepithelial neoplasia II) 10/02    cx bx   CIN III (cervical intraepithelial neoplasia III) 12/02   S/P LEEP   Depression    HPV in female    Immune to rubella 04/2011   POSITIVE RUBELLA TITER    Past Surgical History:  Procedure Laterality Date   CESAREAN SECTION  11/29/2012   Procedure: CESAREAN SECTION;  Surgeon: Lenoard Aden, MD;  Location: [facility];  Service: Obstetrics;  Laterality: N/A;  Primary cesarean section with delivery of baby boy at 62.   Apgars 8/8.   GYNECOLOGIC CRYOSURGERY  06/27/09   TYMPANOSTOMY TUBE PLACEMENT      Current Outpatient Medications on File Prior to Visit  Medication Sig Dispense Refill   adapalene (DIFFERIN) 0.1 % gel Apply topically at bedtime. 45 g 12   sertraline (ZOLOFT) 50 MG tablet TAKE 1 AND 1/2 TABLETS DAILY BY MOUTH 45 tablet 0   spironolactone (ALDACTONE) 50 MG tablet TAKE 1 TABLET BY MOUTH EVERY DAY 90 tablet 4   No current facility-administered medications on file prior to visit.    Social History   Socioeconomic History   Marital status: Married    Spouse name: Not on file   Number of children: Not on file   Years of education: Not on file   Highest education level: Not on file  Occupational History   Not on file  Tobacco Use   Smoking status: Never   Smokeless tobacco: Never  Vaping Use   Vaping status: Never Used  Substance and Sexual Activity   Alcohol use: Yes    Comment: 4 drinks a month   Drug use: No   Sexual activity: Yes    Partners: Male    Birth control/protection: Pill  Comment: intercourse age 84, sexual partners more than 5  Other Topics Concern   Not on file  Social History Narrative   Lives with husband and son   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%      Gun in home - no   Social Determinants of Corporate investment banker Strain: Not on file  Food Insecurity: Not on file  Transportation Needs: Not on file  Physical Activity: Not on file  Stress: Not on file  Social Connections: Not on file  Intimate Partner Violence: Not on file    Family History  Problem Relation Age of Onset   Hypertension Father    Dementia Father    Cancer Paternal Grandmother        LUNG CANCER--SMOKER   Stroke Paternal Grandfather    Cancer Other        ovarian    No Known Allergies    PE Today's Vitals    10/16/23 0750  BP: 102/70  Pulse: 67  SpO2: 100%  Weight: 122 lb (55.3 kg)  Height: 5' 3.5" (1.613 m)   Body mass index is 21.27 kg/m.  Physical Exam Vitals reviewed. Exam conducted with a chaperone present.  Constitutional:      General: She is not in acute distress.    Appearance: Normal appearance.  HENT:     Head: Normocephalic and atraumatic.     Nose: Nose normal.  Eyes:     Extraocular Movements: Extraocular movements intact.     Conjunctiva/sclera: Conjunctivae normal.  Neck:     Thyroid: No thyroid mass, thyromegaly or thyroid tenderness.  Pulmonary:     Effort: Pulmonary effort is normal.  Chest:     Chest wall: No mass or tenderness.  Breasts:    Right: Normal. No swelling, mass, nipple discharge, skin change or tenderness.     Left: Normal. No swelling, mass, nipple discharge, skin change or tenderness.  Abdominal:     General: There is no distension.     Palpations: Abdomen is soft.     Tenderness: There is no abdominal tenderness.  Genitourinary:    General: Normal vulva.     Exam position: Lithotomy position.     Urethra: No prolapse.     Vagina: Normal. No vaginal discharge or bleeding.     Cervix: Normal. No lesion.     Uterus: Normal. Not enlarged and not tender.      Adnexa: Right adnexa normal and left adnexa normal.  Musculoskeletal:        General: Normal range of motion.     Cervical back: Normal range of motion.  Lymphadenopathy:     Upper Body:     Right upper body: No axillary adenopathy.     Left upper body: No axillary adenopathy.     Lower Body: No right inguinal adenopathy. No left inguinal adenopathy.  Skin:    General: Skin is warm and dry.  Neurological:     General: No focal deficit present.     Mental Status: She is alert.  Psychiatric:        Mood and Affect: Mood normal.        Behavior: Behavior normal.       Assessment and Plan:        Well woman exam with routine gynecological exam Assessment & Plan: Cervical  cancer screening performed according to ASCCP guidelines. Encouraged annual mammogram screening Colonoscopy never, wants cologuard this year DXA  N/A Labs and immunizations with her primary Encouraged safe sexual practices as indicated Encouraged healthy lifestyle practices with diet and exercise For patients under 50yo, I recommend 1000mg  calcium daily and 600IU of vitamin D daily.   Orders: -     VITAMIN D 25 Hydroxy (Vit-D Deficiency, Fractures) -     Thyroid Panel With TSH -     Hemoglobin A1C w/out eAG -     CBC -     COMPLETE METABOLIC PANEL WITH GFR -     Lipid panel  Colon cancer screening -     Cologuard  Oral contraceptive pill surveillance -     Drospirenone-Ethinyl Estradiol; Take 1 tablet by mouth daily.  Dispense: 84 tablet; Refill: 4  Vitamin D deficiency -     Vitamin D; Take 1 tablet (2,000 Units total) by mouth daily.  Dispense: 90 tablet; Refill: 3  Lipids abnormal  Addended 10/20/23 to reflex updated labs and rx.  Rosalyn Gess, MD

## 2023-10-16 NOTE — Assessment & Plan Note (Signed)
Cervical cancer screening performed according to ASCCP guidelines. Encouraged annual mammogram screening Colonoscopy never, wants cologuard this year DXA N/A Labs and immunizations with her primary Encouraged safe sexual practices as indicated Encouraged healthy lifestyle practices with diet and exercise For patients under 47yo, I recommend 1000mg  calcium daily and 600IU of vitamin D daily.

## 2023-10-16 NOTE — Addendum Note (Signed)
Addended by: Rosalyn Gess on: 10/16/2023 08:31 AM   Modules accepted: Orders

## 2023-10-16 NOTE — Patient Instructions (Signed)

## 2023-10-17 LAB — CBC
HCT: 42.1 % (ref 35.0–45.0)
Hemoglobin: 13.9 g/dL (ref 11.7–15.5)
MCH: 29.8 pg (ref 27.0–33.0)
MCHC: 33 g/dL (ref 32.0–36.0)
MCV: 90.1 fL (ref 80.0–100.0)
MPV: 11.5 fL (ref 7.5–12.5)
Platelets: 237 10*3/uL (ref 140–400)
RBC: 4.67 10*6/uL (ref 3.80–5.10)
RDW: 12.6 % (ref 11.0–15.0)
WBC: 6 10*3/uL (ref 3.8–10.8)

## 2023-10-17 LAB — THYROID PANEL WITH TSH
Free Thyroxine Index: 2.1 (ref 1.4–3.8)
T3 Uptake: 21 % — ABNORMAL LOW (ref 22–35)
T4, Total: 10.2 ug/dL (ref 5.1–11.9)
TSH: 4.34 m[IU]/L

## 2023-10-17 LAB — COMPLETE METABOLIC PANEL WITH GFR
AG Ratio: 2.1 (calc) (ref 1.0–2.5)
ALT: 14 U/L (ref 6–29)
AST: 14 U/L (ref 10–35)
Albumin: 4.8 g/dL (ref 3.6–5.1)
Alkaline phosphatase (APISO): 38 U/L (ref 31–125)
BUN: 11 mg/dL (ref 7–25)
CO2: 27 mmol/L (ref 20–32)
Calcium: 9.7 mg/dL (ref 8.6–10.2)
Chloride: 100 mmol/L (ref 98–110)
Creat: 0.87 mg/dL (ref 0.50–0.99)
Globulin: 2.3 g/dL (ref 1.9–3.7)
Glucose, Bld: 94 mg/dL (ref 65–99)
Potassium: 4.1 mmol/L (ref 3.5–5.3)
Sodium: 137 mmol/L (ref 135–146)
Total Bilirubin: 0.5 mg/dL (ref 0.2–1.2)
Total Protein: 7.1 g/dL (ref 6.1–8.1)
eGFR: 83 mL/min/{1.73_m2} (ref >=60)

## 2023-10-17 LAB — LIPID PANEL
Cholesterol: 239 mg/dL — ABNORMAL HIGH (ref <200)
HDL: 106 mg/dL (ref >=50)
LDL Cholesterol (Calc): 112 mg/dL — ABNORMAL HIGH
Non-HDL Cholesterol (Calc): 133 mg/dL — ABNORMAL HIGH (ref <130)
Total CHOL/HDL Ratio: 2.3 (calc) (ref <5.0)
Triglycerides: 107 mg/dL (ref <150)

## 2023-10-17 LAB — HEMOGLOBIN A1C W/OUT EAG: Hgb A1c MFr Bld: 5.5 %{Hb} (ref <5.7)

## 2023-10-17 LAB — VITAMIN D 25 HYDROXY (VIT D DEFICIENCY, FRACTURES): Vit D, 25-Hydroxy: 8 ng/mL — ABNORMAL LOW (ref 30–100)

## 2023-10-20 DIAGNOSIS — E559 Vitamin D deficiency, unspecified: Secondary | ICD-10-CM | POA: Insufficient documentation

## 2023-10-20 DIAGNOSIS — E7889 Other lipoprotein metabolism disorders: Secondary | ICD-10-CM | POA: Insufficient documentation

## 2023-10-20 MED ORDER — VITAMIN D 50 MCG (2000 UT) PO TABS: 2000.0000 [IU] | ORAL_TABLET | Freq: Every day | ORAL | 3 refills | Status: AC

## 2023-10-20 NOTE — Addendum Note (Signed)
Addended by: Darrell Jewel V on: 10/20/2023 11:17 AM   Modules accepted: Orders

## 2023-10-21 ENCOUNTER — Other Ambulatory Visit: Payer: Self-pay | Admitting: Obstetrics and Gynecology

## 2023-10-21 DIAGNOSIS — F418 Other specified anxiety disorders: Secondary | ICD-10-CM

## 2023-10-21 NOTE — Telephone Encounter (Signed)
Med refill request: Sertraline Last AEX: 10/16/2023 Next AEX: not scheduled Last MMG (if hormonal med) n/a Refill authorized: Last Rx sent #45 with zero refills on 10/14/2023. Rx was denied as this was sent on 10/14/23.

## 2023-10-23 DIAGNOSIS — Z1211 Encounter for screening for malignant neoplasm of colon: Secondary | ICD-10-CM | POA: Diagnosis not present

## 2023-10-31 LAB — COLOGUARD: COLOGUARD: NEGATIVE

## 2023-11-15 ENCOUNTER — Other Ambulatory Visit: Payer: Self-pay | Admitting: Obstetrics and Gynecology

## 2023-11-15 DIAGNOSIS — F418 Other specified anxiety disorders: Secondary | ICD-10-CM

## 2023-11-17 NOTE — Telephone Encounter (Signed)
 Med refill request: Zoloft Last AEX: 10/16/2023 Next AEX: not scheduled Last MMG (if hormonal med) Refill authorized: Last Rx sent #45 with zero refills on 10/14/2023. Please approve or deny as appropriate.

## 2024-01-05 ENCOUNTER — Other Ambulatory Visit: Payer: Self-pay | Admitting: Obstetrics and Gynecology

## 2024-01-05 DIAGNOSIS — Z1231 Encounter for screening mammogram for malignant neoplasm of breast: Secondary | ICD-10-CM

## 2024-01-11 ENCOUNTER — Other Ambulatory Visit: Payer: Self-pay | Admitting: Obstetrics & Gynecology

## 2024-01-11 DIAGNOSIS — E282 Polycystic ovarian syndrome: Secondary | ICD-10-CM

## 2024-01-12 NOTE — Telephone Encounter (Signed)
 Medication refill request: spironolactone  Last AEX:  10/16/23 Next AEX: not scheduled  Last MMG (if hormonal medication request): 12/05/22 Bi-rads 1 neg Scheduled 01/15/24  Refill authorized: please advise

## 2024-01-15 ENCOUNTER — Ambulatory Visit
Admission: RE | Admit: 2024-01-15 | Discharge: 2024-01-15 | Disposition: A | Discharge status: Home / Self Care | Payer: BC Managed Care – PPO | Source: Ambulatory Visit | Attending: Obstetrics and Gynecology | Admitting: Obstetrics and Gynecology

## 2024-01-15 DIAGNOSIS — Z1231 Encounter for screening mammogram for malignant neoplasm of breast: Secondary | ICD-10-CM

## 2024-01-20 ENCOUNTER — Encounter: Payer: Self-pay | Admitting: Obstetrics and Gynecology

## 2024-11-15 DIAGNOSIS — F418 Other specified anxiety disorders: Secondary | ICD-10-CM

## 2024-11-15 NOTE — Telephone Encounter (Signed)
 Med refill request: SERTRALINE  (ZOLOFT ) 50 MG TAB Last AEX: 10/16/23 [facility] Next AEX: not scheduled, sent message to front desk to schedule pt for annual visit Last MMG (if hormonal med) 01/15/24 Refill authorized: Please Advise? Last Rx sent #[id number] with 3 refills on 11/17/23 [facility]

## 2024-12-29 ENCOUNTER — Ambulatory Visit: Admitting: Obstetrics and Gynecology
